# Patient Record
Sex: Male | Born: 1950 | Race: Black or African American | Hispanic: No | Marital: Single | Smoking: Never smoker
Health system: Southern US, Community
[De-identification: ages and names within clinical notes are randomized; demographics above are authoritative.]

## PROBLEM LIST (undated history)

## (undated) DIAGNOSIS — N189 Chronic kidney disease, unspecified: Secondary | ICD-10-CM

## (undated) DIAGNOSIS — G4730 Sleep apnea, unspecified: Secondary | ICD-10-CM

## (undated) DIAGNOSIS — M1990 Unspecified osteoarthritis, unspecified site: Secondary | ICD-10-CM

## (undated) DIAGNOSIS — E7800 Pure hypercholesterolemia, unspecified: Secondary | ICD-10-CM

## (undated) DIAGNOSIS — N200 Calculus of kidney: Secondary | ICD-10-CM

## (undated) DIAGNOSIS — K219 Gastro-esophageal reflux disease without esophagitis: Secondary | ICD-10-CM

## (undated) DIAGNOSIS — E119 Type 2 diabetes mellitus without complications: Secondary | ICD-10-CM

## (undated) DIAGNOSIS — I10 Essential (primary) hypertension: Secondary | ICD-10-CM

## (undated) DIAGNOSIS — H409 Unspecified glaucoma: Secondary | ICD-10-CM

## (undated) DIAGNOSIS — T884XXA Failed or difficult intubation, initial encounter: Secondary | ICD-10-CM

## (undated) HISTORY — PX: TONSILLECTOMY: SUR1361

## (undated) HISTORY — PX: OTHER SURGICAL HISTORY: SHX169

## (undated) HISTORY — PX: WRIST SURGERY: SHX841

## (undated) HISTORY — DX: Essential (primary) hypertension: I10

## (undated) HISTORY — DX: Type 2 diabetes mellitus without complications: E11.9

---

## 2004-10-03 ENCOUNTER — Ambulatory Visit: Payer: Self-pay | Admitting: Orthopedic Surgery

## 2004-12-26 ENCOUNTER — Ambulatory Visit: Payer: Self-pay | Admitting: Internal Medicine

## 2005-09-26 ENCOUNTER — Ambulatory Visit: Payer: Self-pay | Admitting: Gastroenterology

## 2007-12-07 ENCOUNTER — Ambulatory Visit: Payer: Self-pay | Admitting: Internal Medicine

## 2009-01-12 ENCOUNTER — Ambulatory Visit: Payer: Self-pay | Admitting: Internal Medicine

## 2010-07-10 ENCOUNTER — Ambulatory Visit: Payer: Self-pay | Admitting: Internal Medicine

## 2010-09-16 ENCOUNTER — Ambulatory Visit: Payer: Self-pay | Admitting: Gastroenterology

## 2010-09-17 LAB — PATHOLOGY REPORT

## 2010-10-02 ENCOUNTER — Ambulatory Visit: Payer: Self-pay | Admitting: Urology

## 2010-12-16 ENCOUNTER — Ambulatory Visit: Payer: Self-pay | Admitting: Urology

## 2011-06-16 ENCOUNTER — Ambulatory Visit: Payer: Self-pay | Admitting: Urology

## 2011-12-15 ENCOUNTER — Ambulatory Visit: Payer: Self-pay | Admitting: Urology

## 2012-06-18 ENCOUNTER — Ambulatory Visit: Payer: Self-pay | Admitting: Urology

## 2012-06-18 DIAGNOSIS — N200 Calculus of kidney: Secondary | ICD-10-CM | POA: Insufficient documentation

## 2012-06-18 DIAGNOSIS — R81 Glycosuria: Secondary | ICD-10-CM | POA: Insufficient documentation

## 2012-06-18 DIAGNOSIS — R972 Elevated prostate specific antigen [PSA]: Secondary | ICD-10-CM | POA: Insufficient documentation

## 2013-06-20 ENCOUNTER — Ambulatory Visit: Payer: Self-pay | Admitting: Urology

## 2013-06-20 DIAGNOSIS — R3915 Urgency of urination: Secondary | ICD-10-CM | POA: Insufficient documentation

## 2013-06-20 DIAGNOSIS — N138 Other obstructive and reflux uropathy: Secondary | ICD-10-CM | POA: Insufficient documentation

## 2013-07-06 ENCOUNTER — Other Ambulatory Visit: Payer: Self-pay | Admitting: Internal Medicine

## 2013-07-06 DIAGNOSIS — R9389 Abnormal findings on diagnostic imaging of other specified body structures: Secondary | ICD-10-CM

## 2013-07-19 ENCOUNTER — Ambulatory Visit
Admission: RE | Admit: 2013-07-19 | Discharge: 2013-07-19 | Disposition: A | Discharge status: Home / Self Care | Payer: Federal, State, Local not specified - PPO | Source: Ambulatory Visit | Attending: Internal Medicine | Admitting: Internal Medicine

## 2013-07-19 DIAGNOSIS — R9389 Abnormal findings on diagnostic imaging of other specified body structures: Secondary | ICD-10-CM

## 2013-07-19 MED ORDER — GADOBENATE DIMEGLUMINE 529 MG/ML IV SOLN
20.0000 mL | Freq: Once | INTRAVENOUS | Status: AC | PRN
  Administered 2013-07-19: 20 mL via INTRAVENOUS

## 2014-03-05 DIAGNOSIS — E1169 Type 2 diabetes mellitus with other specified complication: Secondary | ICD-10-CM | POA: Insufficient documentation

## 2014-03-05 DIAGNOSIS — E785 Hyperlipidemia, unspecified: Secondary | ICD-10-CM | POA: Insufficient documentation

## 2014-03-07 DIAGNOSIS — I10 Essential (primary) hypertension: Secondary | ICD-10-CM | POA: Insufficient documentation

## 2014-05-02 DIAGNOSIS — E119 Type 2 diabetes mellitus without complications: Secondary | ICD-10-CM | POA: Insufficient documentation

## 2014-05-02 DIAGNOSIS — I739 Peripheral vascular disease, unspecified: Secondary | ICD-10-CM | POA: Insufficient documentation

## 2014-09-04 ENCOUNTER — Ambulatory Visit (INDEPENDENT_AMBULATORY_CARE_PROVIDER_SITE_OTHER): Payer: Federal, State, Local not specified - PPO | Admitting: Podiatry

## 2014-09-04 DIAGNOSIS — E119 Type 2 diabetes mellitus without complications: Secondary | ICD-10-CM

## 2014-09-04 MED ORDER — NAFTIFINE HCL 1 % EX CREA: TOPICAL_CREAM | Freq: Every day | CUTANEOUS | Status: DC

## 2014-09-04 NOTE — Progress Notes (Signed)
No lesions noted today. Some discoloration in hallux B apparent, mostly on R. Needs new Rx for  Topical AF med., for occasional interdigital tinea.  Pulses are strongly palpable bilaterally. Neurologic sensorium is intact her Semmes-Weinstein monofilament. No changes in physical exam no open lesions no skin conditions other than a mild tinea pedis interdigitally.  Assessment: Interdigital tinea pedis with diabetes without complications.  Plan: Wrote another prescription for Naftin cream 1% follow-up with him as needed.

## 2014-09-08 ENCOUNTER — Ambulatory Visit: Payer: Self-pay

## 2015-01-08 ENCOUNTER — Other Ambulatory Visit: Payer: Self-pay

## 2015-01-17 ENCOUNTER — Ambulatory Visit (INDEPENDENT_AMBULATORY_CARE_PROVIDER_SITE_OTHER): Payer: Federal, State, Local not specified - PPO | Admitting: Podiatry

## 2015-01-17 VITALS — BP 117/69 | HR 80 | Resp 16

## 2015-01-17 DIAGNOSIS — L03011 Cellulitis of right finger: Secondary | ICD-10-CM

## 2015-01-17 DIAGNOSIS — L600 Ingrowing nail: Secondary | ICD-10-CM | POA: Diagnosis not present

## 2015-01-17 DIAGNOSIS — L03031 Cellulitis of right toe: Secondary | ICD-10-CM

## 2015-01-17 NOTE — Patient Instructions (Signed)

## 2015-01-17 NOTE — Progress Notes (Signed)
Patient presents today with an injury to the toenail of the 5th toe right. He states that he hit the toe last week and the toenail became loose. He has been cleaning with peroxide.   Objective: Vital signs stable alert and oriented x 3. Pulses are strong and palpable. The toenail appears loose and detached from the nail bed on the 5th digit right.  Assessment: Paronychia 5th digit right foot.  Plan: Nail avulsion was performed today after local anesthesia was achieved. Patient tolerated procedure well as the nail was removed in total. Patient will start soaking it twice a day in Betadine warm water and apply antibiotic ointment and covered with band aid daily. These instructions were provided and reviewed. Will follow up with him in 1 week.

## 2015-01-25 ENCOUNTER — Encounter: Payer: Self-pay | Admitting: Podiatry

## 2015-01-25 ENCOUNTER — Ambulatory Visit (INDEPENDENT_AMBULATORY_CARE_PROVIDER_SITE_OTHER): Payer: Federal, State, Local not specified - PPO | Admitting: Podiatry

## 2015-01-25 VITALS — BP 132/84 | HR 69 | Resp 17

## 2015-01-25 DIAGNOSIS — Z9889 Other specified postprocedural states: Secondary | ICD-10-CM

## 2015-01-25 DIAGNOSIS — L03031 Cellulitis of right toe: Secondary | ICD-10-CM

## 2015-01-25 DIAGNOSIS — L03011 Cellulitis of right finger: Secondary | ICD-10-CM

## 2015-01-25 NOTE — Patient Instructions (Addendum)
Soak Instructions    THE DAY AFTER THE PROCEDURE  Place 1/4 cup of epsom salts in a quart of warm tap water.  Submerge your foot or feet with outer bandage intact for the initial soak; this will allow the bandage to become moist and wet for easy lift off.  Once you remove your bandage, continue to soak in the solution for 20 minutes.  This soak should be done twice a day.  Next, remove your foot or feet from solution, blot dry the affected area and cover.  You may use a band aid large enough to cover the area or use gauze and tape.  Apply other medications to the area as directed by the doctor such as polysporin neosporin.  IF YOUR SKIN BECOMES IRRITATED WHILE USING THESE INSTRUCTIONS, IT IS OKAY TO SWITCH TO  WHITE VINEGAR AND WATER. Or you may use antibacterial soap and water to keep the toe clean  Monitor for any signs/symptoms of infection. Call the office immediately if any occur or go directly to the emergency room. Call with any questions/concerns.   ---   Continue soaking in epsom salts twice a day followed by antibiotic ointment and a band-aid. Can leave uncovered at night. Continue this until completely healed.  If the area has not healed in 2 weeks, call the office for follow-up appointment, or sooner if any problems arise.  Monitor for any signs/symptoms of infection. Call the office immediately if any occur or go directly to the emergency room. Call with any questions/concerns.  

## 2015-01-28 ENCOUNTER — Encounter: Payer: Self-pay | Admitting: Podiatry

## 2015-01-28 NOTE — Progress Notes (Signed)
Patient ID: Devon Valencia, male   DOB: 05/01/1951, 64 y.o.   MRN: [id number]  Subjective: Patient presents today for follow-up evaluation s/p right 5th digit toenail removal due to paronychia.  he states he's been soaking his foot twice a day in Betadine soaks covering with antibiotic ointment and a Band-Aid. He states that since the procedure he has had no pain to the area and denies any redness or drainage. Denies any systemic complaints as fevers, chills, nausea, vomiting. No other complaints at this time.   Objective : AAO 3, NAD Neurovascular status intact Status post right fifth digit toenail removal. There is no tenderness to palpation around the area and there is no swelling erythema, ascending cellulitis, fluctuance, crepitus, drainage, malodor. Small scab is starting to form over the procedure site. No other areas of tenderness to bilateral lower chemise. No edema, erythema, increase in warmth. There is no pain with calf compression, swelling, warmth, erythema.  Assessment: Status post right fifth digit toenail removal with resolved paronychia  Plan: Continue soaking in epsom salts twice a day followed by antibiotic ointment and a band-aid. Can leave uncovered at night. Continue this until completely healed.  If the area has not healed in 2 weeks, call the office for follow-up appointment, or sooner if any problems arise.  Monitor for any signs/symptoms of infection. Call the office immediately if any occur or go directly to the emergency room. Call with any questions/concerns.  Ovid CurdMatthew Danyela Posas, DPM

## 2015-04-13 ENCOUNTER — Emergency Department
Admission: EM | Admit: 2015-04-13 | Discharge: 2015-04-13 | Disposition: A | Discharge status: Home / Self Care | Payer: Medicare Other | Attending: Emergency Medicine | Admitting: Emergency Medicine

## 2015-04-13 ENCOUNTER — Encounter: Payer: Self-pay | Admitting: Emergency Medicine

## 2015-04-13 DIAGNOSIS — E119 Type 2 diabetes mellitus without complications: Secondary | ICD-10-CM | POA: Diagnosis not present

## 2015-04-13 DIAGNOSIS — I10 Essential (primary) hypertension: Secondary | ICD-10-CM | POA: Diagnosis not present

## 2015-04-13 DIAGNOSIS — Z7982 Long term (current) use of aspirin: Secondary | ICD-10-CM | POA: Insufficient documentation

## 2015-04-13 DIAGNOSIS — R21 Rash and other nonspecific skin eruption: Secondary | ICD-10-CM | POA: Diagnosis present

## 2015-04-13 DIAGNOSIS — Z79899 Other long term (current) drug therapy: Secondary | ICD-10-CM | POA: Insufficient documentation

## 2015-04-13 DIAGNOSIS — Z7951 Long term (current) use of inhaled steroids: Secondary | ICD-10-CM | POA: Insufficient documentation

## 2015-04-13 LAB — GLUCOSE, CAPILLARY: Glucose-Capillary: 134 mg/dL — ABNORMAL HIGH (ref 65–99)

## 2015-04-13 MED ORDER — PREDNISONE 10 MG PO TABS: ORAL_TABLET | ORAL | Status: DC

## 2015-04-13 MED ORDER — HYDROXYZINE HCL 25 MG PO TABS: 25.0000 mg | ORAL_TABLET | Freq: Four times a day (QID) | ORAL | Status: DC | PRN

## 2015-04-13 NOTE — ED Provider Notes (Signed)
[facility] Emergency Department Devon Valencia Note  ____________________________________________  Time seen: Approximately 7:29 AM  I have reviewed the triage vital signs and the nursing notes.   HISTORY  Chief Complaint Rash and Fever  HPI Devon Valencia is a 64 y.o. male is here with complaint of rash for approximate 5 days. Patient states that he has been doing some yard work in the beginning of the week when he first noticed the rash. He states that it is itching therefore he is scratching. He has noticed some outbreaks on his upper extremities and abdomen but has not noticed any on his lower extremities. He denies any known exposure to poison oak or poison ivy. He states that it is possible these are mosquito bites. He was seen Wednesday at [facility] clinic by Devon Valencia who prescribed Kenalog cream. Patient states that this helps temporarily but is not making it go away. Patient is diabetic. This morning he felt "warm" and wondered if he was running fever.   Past Medical History  Diagnosis Date  . Hypertension   . Diabetes mellitus without complication     Patient Active Problem List   Diagnosis Date Noted  . Peripheral blood vessel disorder 05/02/2014  . Diabetes mellitus, type 2 05/02/2014  . Essential (primary) hypertension 03/07/2014  . HLD (hyperlipidemia) 03/05/2014  . Benign prostatic hyperplasia with urinary obstruction 06/20/2013  . Urgency of micturation 06/20/2013  . Calculus of kidney 06/18/2012  . Glucose found in urine on examination 06/18/2012    Past Surgical History  Procedure Laterality Date  . Tonsillectomy    . Wrist surgery Right     Tendonitis    Current Outpatient Rx  Name  Route  Sig  Dispense  Refill  . aspirin EC 81 MG tablet   Oral   Take by mouth.         Marland Kitchen atorvastatin (LIPITOR) 40 MG tablet   Oral   Take by mouth.         . brimonidine-timolol (COMBIGAN) 0.2-0.5 % ophthalmic solution                . Canagliflozin-Metformin HCl (INVOKAMET) 50-1000 MG TABS   Oral   Take by mouth.         . losartan (COZAAR) 50 MG tablet   Oral   Take by mouth.         . ondansetron (ZOFRAN) 4 MG tablet   Oral   Take 4 mg by mouth every 8 (eight) hours as needed for nausea or vomiting.         . Azelastine-Fluticasone 137-50 MCG/ACT SUSP      by Each Nare route.         Marland Kitchen BYDUREON 2 MG SRER                 Dispense as written.   . hydrOXYzine (ATARAX/VISTARIL) 25 MG tablet   Oral   Take 1 tablet (25 mg total) by mouth every 6 (six) hours as needed for itching.   30 tablet   0   . naftifine (NAFTIN) 1 % cream   Topical   Apply topically daily. Apply to affected area twice daily.   90 g   5   . Potassium Citrate 15 MEQ (1620 MG) TBCR               . predniSONE (DELTASONE) 10 MG tablet      Take 3 tablets once a day for 3 days  30 tablet   0     Allergies Lisinopril  No family history on file.  Social History Social History  Substance Use Topics  . Smoking status: Never Smoker   . Smokeless tobacco: Never Used  . Alcohol Use: No    Review of Systems Constitutional: Questionable fever/no chills ENT: No sore throat. Cardiovascular: Denies chest pain. Respiratory: Denies shortness of breath. Gastrointestinal:   No nausea, no vomiting.   Genitourinary: Negative for dysuria. Musculoskeletal: Negative for back pain. Skin: Positive for rash torso and upper extremities Neurological: Negative for headaches, focal weakness or numbness.  10-point ROS otherwise negative.  ____________________________________________   PHYSICAL EXAM:  VITAL SIGNS: ED Triage Vitals  Enc Vitals Group     BP 04/13/15 0723 139/84 mmHg     Pulse Rate 04/13/15 0723 88     Resp --      Temp 04/13/15 0723 98.1 F (36.7 C)     Temp Source 04/13/15 0723 Oral     SpO2 04/13/15 0723 97 %     Weight 04/13/15 0723 221 lb (100.245 kg)     Height 04/13/15 0723  (1.676  m)     Head Cir --      Peak Flow --      Pain Score --      Pain Loc --      Pain Edu? --      Excl. in GC? --     Constitutional: Alert and oriented. Well appearing and in no acute distress. Eyes: Conjunctivae are normal. PERRL. EOMI. Head: Atraumatic. Nose: No congestion/rhinnorhea. Neck: No stridor.   Cardiovascular: Normal rate, regular rhythm. Grossly normal heart sounds.  Good peripheral circulation. Respiratory: Normal respiratory effort.  No retractions. Lungs CTAB. Gastrointestinal: Soft and nontender. No distention. Musculoskeletal: No lower extremity tenderness nor edema.  No joint effusions. Neurologic:  Normal speech and language. No gross focal neurologic deficits are appreciated. No gait instability. Skin:  Skin is warm, dry and intact. Upper extremities and umbilicus has red isolated areas of erythematous papules. A few of these have open caps that appear to be scratched. These are isolated and discrete. There is one erythematous papule nontender right mid chest area that is isolated. Psychiatric: Mood and affect are normal. Speech and behavior are normal.  ____________________________________________   LABS (all labs ordered are listed, but only abnormal results are displayed)  Labs Reviewed  GLUCOSE, CAPILLARY - Abnormal; Notable for the following:    Glucose-Capillary 134 (*)    All other components within normal limits  CBG MONITORING, ED    PROCEDURES  Procedure(s) performed: None  Critical Care performed: No  ____________________________________________   INITIAL IMPRESSION / ASSESSMENT AND PLAN / ED COURSE  Pertinent labs & imaging results that were available during my care of the patient were reviewed by me and considered in my medical decision making (see chart for details).  She was advised to follow-up with dermatology if not improving. 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 We discussed  not scratching at these areas because of his diabetes they could get infected very easily. He is to continue using his Kenalog cream to the areas. He was also discharged on prednisone 30 mg once a day for 3 days. He is also given a prescription for Atarax 25 mg every 6 hours when necessary itching. He was given the name of 2 dermatology offices to contact if any continued problems. ____________________________________________  FINAL CLINICAL IMPRESSION(S) / ED DIAGNOSES  Final diagnoses:  Rash and nonspecific skin eruption      Tommi Rumps, PA-C 04/13/15 1610  Arnaldo Natal, MD 04/13/15 [phone]

## 2015-04-13 NOTE — Discharge Instructions (Signed)
° °  White her taking prednisone please check blood sugars before meals and at bedtime. Take medication as prescribed. Follow-up with your family doctor or call Dr. Pilar Plate, dermatologist for further evaluation of your skin rash You may continue using the cream that was prescribed to you this week as well.

## 2015-04-13 NOTE — ED Notes (Signed)
BGL 134

## 2015-04-13 NOTE — ED Notes (Signed)
Patient to vesicular rash to extremities and trunk. Patient states he thought he had fever this am, but did not check temperature to be sure. Patient is diabetic, checked blood sugar this morning with result less than 120. States initial rash started 5 days ago. Denies any drainage. +Itching.

## 2015-05-21 DIAGNOSIS — Z0000 Encounter for general adult medical examination without abnormal findings: Secondary | ICD-10-CM | POA: Insufficient documentation

## 2015-08-21 ENCOUNTER — Ambulatory Visit (INDEPENDENT_AMBULATORY_CARE_PROVIDER_SITE_OTHER): Payer: Federal, State, Local not specified - PPO | Admitting: Podiatry

## 2015-08-21 ENCOUNTER — Encounter: Payer: Self-pay | Admitting: Podiatry

## 2015-08-21 DIAGNOSIS — B351 Tinea unguium: Secondary | ICD-10-CM | POA: Diagnosis not present

## 2015-08-21 DIAGNOSIS — M79676 Pain in unspecified toe(s): Secondary | ICD-10-CM

## 2015-08-21 DIAGNOSIS — E119 Type 2 diabetes mellitus without complications: Secondary | ICD-10-CM | POA: Diagnosis not present

## 2015-08-21 NOTE — Patient Instructions (Signed)
Diabetes and Foot Care Diabetes may cause you to have problems because of poor blood supply (circulation) to your feet and legs. This may cause the skin on your feet to become thinner, break easier, and heal more slowly. Your skin may become dry, and the skin may peel and crack. You may also have nerve damage in your legs and feet causing decreased feeling in them. You may not notice minor injuries to your feet that could lead to infections or more serious problems. Taking care of your feet is one of the most important things you can do for yourself.  HOME CARE INSTRUCTIONS  Wear shoes at all times, even in the house. Do not go barefoot. Bare feet are easily injured.  Check your feet daily for blisters, cuts, and redness. If you cannot see the bottom of your feet, use a mirror or ask someone for help.  Wash your feet with warm water (do not use hot water) and mild soap. Then pat your feet and the areas between your toes until they are completely dry. Do not soak your feet as this can dry your skin.  Apply a moisturizing lotion or petroleum jelly (that does not contain alcohol and is unscented) to the skin on your feet and to dry, brittle toenails. Do not apply lotion between your toes.  Trim your toenails straight across. Do not dig under them or around the cuticle. File the edges of your nails with an emery board or nail file.  Do not cut corns or calluses or try to remove them with medicine.  Wear clean socks or stockings every day. Make sure they are not too tight. Do not wear knee-high stockings since they may decrease blood flow to your legs.  Wear shoes that fit properly and have enough cushioning. To break in new shoes, wear them for just a few hours a day. This prevents you from injuring your feet. Always look in your shoes before you put them on to be sure there are no objects inside.  Do not cross your legs. This may decrease the blood flow to your feet.  If you find a minor scrape,  cut, or break in the skin on your feet, keep it and the skin around it clean and dry. These areas may be cleansed with mild soap and water. Do not cleanse the area with peroxide, alcohol, or iodine.  When you remove an adhesive bandage, be sure not to damage the skin around it.  If you have a wound, look at it several times a day to make sure it is healing.  Do not use heating pads or hot water bottles. They may burn your skin. If you have lost feeling in your feet or legs, you may not know it is happening until it is too late.  Make sure your health care provider performs a complete foot exam at least annually or more often if you have foot problems. Report any cuts, sores, or bruises to your health care provider immediately. SEEK MEDICAL CARE IF:   You have an injury that is not healing.  You have cuts or breaks in the skin.  You have an ingrown nail.  You notice redness on your legs or feet.  You feel burning or tingling in your legs or feet.  You have pain or cramps in your legs and feet.  Your legs or feet are numb.  Your feet always feel cold. SEEK IMMEDIATE MEDICAL CARE IF:   There is increasing redness,   swelling, or pain in or around a wound.  There is a red line that goes up your leg.  Pus is coming from a wound.  You develop a fever or as directed by your health care provider.  You notice a bad smell coming from an ulcer or wound.   This information is not intended to replace advice given to you by your health care provider. Make sure you discuss any questions you have with your health care provider.   Document Released: 06/27/2000 Document Revised: 03/02/2013 Document Reviewed: 12/07/2012 Elsevier Interactive Patient Education 2016 [employer]  

## 2015-08-24 NOTE — Progress Notes (Signed)
Patient ID: Devon Valencia, male   DOB: 03-23-1951, 65 y.o.   MRN: [id number]  Devon Valencia presents today for diabetic risk assessment and for painful, elongated toenails show which she cannot trim himself. He denies any drainage or pus coming from around the nails and denies any swelling or redness. He states the nails cause tenderness with pressure in shoe gear. He believes that his A1c is around 7 unsure. Denies any change in her numbness. No claudication symptoms. No ulcerations. No other complaints at this time.  Objective : AAO 3, NAD DP/PT pulses 2/4, CRT less than 3 seconds Protective sensation appears to be intact with Devon Valencia monofilament Nails are hypertrophic, dystrophic, brittle, discolored, elongated 10. There is no surrounding erythema or drainage. There is tenderness to palpation to nails 1-5 bilaterally. No open lesions or pre-ulcerative lesions are identified bilaterally. No other areas of tenderness to bilateral lower extremities. There is no pain with calf compression, swelling, warmth, erythema.  Assessment: 65 year old male presents for diabetic risk assessment as well as for symptomatic onychomycosis  Plan: -Treatment options discussed including all alternatives, risks, and complications -Nails sharply debrided 10 without complications or bleeding -Discussed the importance of daily foot inspection -Follow-up in 3 months or sooner if any problems arise. In the meantime, encouraged to call the office with any questions, concerns, change in symptoms.   Ovid Curd, DPM

## 2015-09-03 ENCOUNTER — Ambulatory Visit: Payer: Federal, State, Local not specified - PPO | Admitting: Podiatry

## 2015-09-03 ENCOUNTER — Ambulatory Visit: Payer: Federal, State, Local not specified - PPO

## 2015-11-19 DIAGNOSIS — G4733 Obstructive sleep apnea (adult) (pediatric): Secondary | ICD-10-CM | POA: Insufficient documentation

## 2015-11-19 DIAGNOSIS — Z9989 Dependence on other enabling machines and devices: Secondary | ICD-10-CM

## 2015-11-20 ENCOUNTER — Ambulatory Visit: Payer: Federal, State, Local not specified - PPO | Admitting: Podiatry

## 2015-12-21 ENCOUNTER — Encounter: Payer: Self-pay | Admitting: *Deleted

## 2015-12-24 ENCOUNTER — Ambulatory Visit: Payer: Medicare Other | Admitting: Anesthesiology

## 2015-12-24 ENCOUNTER — Encounter
Admission: RE | Disposition: A | Discharge status: Home / Self Care | Payer: Self-pay | Source: Ambulatory Visit | Attending: Gastroenterology

## 2015-12-24 ENCOUNTER — Ambulatory Visit
Admission: RE | Admit: 2015-12-24 | Discharge: 2015-12-24 | Disposition: A | Discharge status: Home / Self Care | Payer: Medicare Other | Source: Ambulatory Visit | Attending: Gastroenterology | Admitting: Gastroenterology

## 2015-12-24 ENCOUNTER — Encounter: Payer: Self-pay | Admitting: *Deleted

## 2015-12-24 DIAGNOSIS — I129 Hypertensive chronic kidney disease with stage 1 through stage 4 chronic kidney disease, or unspecified chronic kidney disease: Secondary | ICD-10-CM | POA: Diagnosis not present

## 2015-12-24 DIAGNOSIS — K5730 Diverticulosis of large intestine without perforation or abscess without bleeding: Secondary | ICD-10-CM | POA: Diagnosis not present

## 2015-12-24 DIAGNOSIS — G4730 Sleep apnea, unspecified: Secondary | ICD-10-CM | POA: Insufficient documentation

## 2015-12-24 DIAGNOSIS — Z7984 Long term (current) use of oral hypoglycemic drugs: Secondary | ICD-10-CM | POA: Insufficient documentation

## 2015-12-24 DIAGNOSIS — E119 Type 2 diabetes mellitus without complications: Secondary | ICD-10-CM | POA: Diagnosis not present

## 2015-12-24 DIAGNOSIS — Z1211 Encounter for screening for malignant neoplasm of colon: Secondary | ICD-10-CM | POA: Diagnosis present

## 2015-12-24 DIAGNOSIS — M1990 Unspecified osteoarthritis, unspecified site: Secondary | ICD-10-CM | POA: Insufficient documentation

## 2015-12-24 DIAGNOSIS — K219 Gastro-esophageal reflux disease without esophagitis: Secondary | ICD-10-CM | POA: Insufficient documentation

## 2015-12-24 DIAGNOSIS — Z7982 Long term (current) use of aspirin: Secondary | ICD-10-CM | POA: Insufficient documentation

## 2015-12-24 DIAGNOSIS — Z86010 Personal history of colonic polyps: Secondary | ICD-10-CM | POA: Insufficient documentation

## 2015-12-24 DIAGNOSIS — Z79899 Other long term (current) drug therapy: Secondary | ICD-10-CM | POA: Insufficient documentation

## 2015-12-24 HISTORY — DX: Chronic kidney disease, unspecified: N18.9

## 2015-12-24 HISTORY — DX: Unspecified osteoarthritis, unspecified site: M19.90

## 2015-12-24 HISTORY — DX: Calculus of kidney: N20.0

## 2015-12-24 HISTORY — DX: Sleep apnea, unspecified: G47.30

## 2015-12-24 HISTORY — PX: COLONOSCOPY WITH PROPOFOL: SHX5780

## 2015-12-24 HISTORY — DX: Pure hypercholesterolemia, unspecified: E78.00

## 2015-12-24 HISTORY — DX: Unspecified glaucoma: H40.9

## 2015-12-24 HISTORY — DX: Gastro-esophageal reflux disease without esophagitis: K21.9

## 2015-12-24 LAB — GLUCOSE, CAPILLARY: GLUCOSE-CAPILLARY: 104 mg/dL — AB (ref 65–99)

## 2015-12-24 SURGERY — COLONOSCOPY WITH PROPOFOL
Anesthesia: General

## 2015-12-24 MED ORDER — LIDOCAINE HCL (PF) 1 % IJ SOLN
INTRAMUSCULAR | Status: AC
  Filled 2015-12-24: qty 2

## 2015-12-24 MED ORDER — LIDOCAINE HCL (PF) 1 % IJ SOLN: 2.0000 mL | Freq: Once | INTRAMUSCULAR | Status: DC

## 2015-12-24 MED ORDER — SODIUM CHLORIDE 0.9 % IV SOLN
INTRAVENOUS | Status: DC
  Administered 2015-12-24 (×2): via INTRAVENOUS

## 2015-12-24 MED ORDER — MIDAZOLAM HCL 2 MG/2ML IJ SOLN
INTRAMUSCULAR | Status: DC | PRN
  Administered 2015-12-24: 1 mg via INTRAVENOUS

## 2015-12-24 MED ORDER — FENTANYL CITRATE (PF) 100 MCG/2ML IJ SOLN
INTRAMUSCULAR | Status: DC | PRN
  Administered 2015-12-24: 50 ug via INTRAVENOUS

## 2015-12-24 MED ORDER — PROPOFOL 500 MG/50ML IV EMUL
INTRAVENOUS | Status: DC | PRN
  Administered 2015-12-24: 150 ug/kg/min via INTRAVENOUS

## 2015-12-24 MED ORDER — PROPOFOL 10 MG/ML IV BOLUS
INTRAVENOUS | Status: DC | PRN
  Administered 2015-12-24: 70 mg via INTRAVENOUS

## 2015-12-24 MED ORDER — SODIUM CHLORIDE 0.9 % IV SOLN: INTRAVENOUS | Status: DC

## 2015-12-24 NOTE — H&P (Signed)
Outpatient short stay form Pre-procedure 12/24/2015 1:33 PM Devon Deem MD  Primary Physician: Dr. Einar Valencia  Reason for visit:  Colonoscopy  History of present illness:  Patient is a 65 year old male presenting today for colonoscopy. His last colonoscopy was in 2012 which showed a hyperplastic polyp at that time however he has had a history previously of adenomatous colon polyps. He is presenting today for follow-up evaluation. He tolerated his prep well. He'll for couple days. He takes no other blood thinning agents or other aspirin products.    Current facility-administered medications:  .  0.9 %  sodium chloride infusion, , Intravenous, Continuous, Devon Deem, MD, Last Rate: 20 mL/hr at 12/24/15 1306 .  0.9 %  sodium chloride infusion, , Intravenous, Continuous, Devon Deem, MD .  lidocaine (PF) (XYLOCAINE) 1 % injection 2 mL, 2 mL, Intradermal, Once, Devon Deem, MD .  lidocaine (PF) (XYLOCAINE) 1 % injection, , , ,   Prescriptions prior to admission  Medication Sig Dispense Refill Last Dose  . aspirin EC 81 MG tablet Take by mouth.   Past Month at Unknown time  . brimonidine-timolol (COMBIGAN) 0.2-0.5 % ophthalmic solution    12/23/2015 at Unknown time  . Dulaglutide (TRULICITY) 1.5 MG/0.5ML SOPN Inject 1.5 mg into the skin every 7 (seven) days.   Past Week at Unknown time  . losartan (COZAAR) 50 MG tablet Take by mouth.   Past Week at Unknown time  . ondansetron (ZOFRAN) 4 MG tablet Take 4 mg by mouth every 8 (eight) hours as needed for nausea or vomiting.   Past Month at Unknown time  . Potassium Citrate 15 MEQ (1620 MG) TBCR    Past Week at Unknown time  . atorvastatin (LIPITOR) 40 MG tablet Take by mouth.     . Azelastine-Fluticasone 137-50 MCG/ACT SUSP Reported on 12/24/2015   Not Taking at Unknown time  . BYDUREON 2 MG SRER Reported on 12/24/2015   Not Taking at Unknown time  . Canagliflozin-Metformin HCl (INVOKAMET) 50-1000 MG TABS Take by mouth.  Reported on 12/24/2015   Not Taking at Unknown time  . hydrOXYzine (ATARAX/VISTARIL) 25 MG tablet Take 1 tablet (25 mg total) by mouth every 6 (six) hours as needed for itching. 30 tablet 0 Not Taking at Unknown time  . naftifine (NAFTIN) 1 % cream Apply topically daily. Apply to affected area twice daily. 90 g 5 Not Taking at Unknown time  . predniSONE (DELTASONE) 10 MG tablet Take 3 tablets once a day for 3 days 30 tablet 0 Not Taking at Unknown time  . triamcinolone cream (KENALOG) 0.1 % Apply 1 application topically 3 (three) times daily. Reported on 12/24/2015   Not Taking at Unknown time     Allergies  Allergen Reactions  . Lisinopril Other (See Comments), Cough and Nausea And Vomiting    cough cough     Past Medical History  Diagnosis Date  . Hypertension   . Diabetes mellitus without complication (HCC)   . Chronic kidney disease   . GERD (gastroesophageal reflux disease)   . Glaucoma   . Hypercholesteremia   . Nephrolithiasis   . Arthritis   . Sleep apnea     Review of systems:      Physical Exam    Heart and lungs: Regular rate and rhythm without rub or gallop, lungs are bilaterally clear.    HEENT: Normocephalic atraumatic eyes are anicteric    Other:     Pertinant exam for procedure: Soft  nontender nondistended bowel sounds positive normoactive.    Planned proceedures: Colonoscopy and indicated procedures. I have discussed the risks benefits and complications of procedures to include not limited to bleeding, infection, perforation and the risk of sedation and the patient wishes to proceed.    Devon DeemMartin U Cahlil Sattar, MD Gastroenterology 12/24/2015  1:33 PM

## 2015-12-24 NOTE — Anesthesia Preprocedure Evaluation (Signed)
Anesthesia Evaluation  Patient identified by MRN, date of birth, ID band Patient awake    Reviewed: Allergy & Precautions, NPO status , Patient's Chart, lab work & pertinent test results, reviewed documented beta blocker date and time   Airway Mallampati: III  TM Distance: >3 FB     Dental  (+) Chipped   Pulmonary sleep apnea ,           Cardiovascular hypertension, Pt. on medications + Peripheral Vascular Disease       Neuro/Psych    GI/Hepatic GERD  ,  Endo/Other  diabetes, Type 2  Renal/GU Renal InsufficiencyRenal disease     Musculoskeletal  (+) Arthritis ,   Abdominal   Peds  Hematology   Anesthesia Other Findings   Reproductive/Obstetrics                             Anesthesia Physical Anesthesia Plan  ASA: III  Anesthesia Plan: General   Post-op Pain Management:    Induction: Intravenous  Airway Management Planned: Nasal Cannula  Additional Equipment:   Intra-op Plan:   Post-operative Plan:   Informed Consent: I have reviewed the patients History and Physical, chart, labs and discussed the procedure including the risks, benefits and alternatives for the proposed anesthesia with the patient or authorized representative who has indicated his/her understanding and acceptance.     Plan Discussed with: CRNA  Anesthesia Plan Comments:         Anesthesia Quick Evaluation

## 2015-12-24 NOTE — Anesthesia Postprocedure Evaluation (Signed)
Anesthesia Post Note  Patient: Devon Valencia  Procedure(s) Performed: Procedure(s) (LRB): COLONOSCOPY WITH PROPOFOL (N/A)  Patient location during evaluation: Endoscopy Anesthesia Type: General Level of consciousness: awake and alert Pain management: pain level controlled Vital Signs Assessment: post-procedure vital signs reviewed and stable Respiratory status: spontaneous breathing, nonlabored ventilation, respiratory function stable and patient connected to nasal cannula oxygen Cardiovascular status: blood pressure returned to baseline and stable Postop Assessment: no signs of nausea or vomiting Anesthetic complications: no    Last Vitals:  Filed Vitals:   12/24/15 1450 12/24/15 1500  BP: 121/75 131/81  Pulse: 58 61  Temp:    Resp: 22 15    Last Pain: There were no vitals filed for this visit.               Lenard SimmerAndrew Takoda Siedlecki

## 2015-12-24 NOTE — Transfer of Care (Signed)
Immediate Anesthesia Transfer of Care Note  Patient: Devon ClarksGeorge Valencia  Procedure(s) Performed: Procedure(s): COLONOSCOPY WITH PROPOFOL (N/A)  Patient Location: PACU  Anesthesia Type:General  Level of Consciousness: sedated  Airway & Oxygen Therapy: Patient Spontanous Breathing and Patient connected to nasal cannula oxygen  Post-op Assessment: Report given to RN and Post -op Vital signs reviewed and stable  Post vital signs: Reviewed and stable  Last Vitals:  Filed Vitals:   12/24/15 1241  BP: 150/80  Pulse: 58  Temp: 36.1 C  Resp: 18    Last Pain: There were no vitals filed for this visit.       Complications: No apparent anesthesia complications

## 2015-12-24 NOTE — Op Note (Signed)
[facility] Gastroenterology Patient Name: Devon ClarksGeorge Agner Procedure Date: 12/24/2015 1:52 PM MRN: [id number] Account #: [id number] Date of Birth: 05/01/1951 Admit Type: Outpatient Age: [age] [facility] 3 Gender: Male Note Status: Finalized Procedure:            Colonoscopy Indications:          Personal history of colonic polyps Providers:            Christena DeemMartin U. Saamiya Jeppsen, MD Referring MD:         Marya AmslerMarshall W. Dareen PianoAnderson MD, MD (Referring MD) Medicines:            Monitored Anesthesia Care Complications:        No immediate complications. Procedure:            Pre-Anesthesia Assessment:                       - ASA Grade Assessment: III - A patient with severe                        systemic disease.                       After obtaining informed consent, the colonoscope was                        passed under direct vision. Throughout the procedure,                        the patient's blood pressure, pulse, and oxygen                        saturations were monitored continuously. The                        Colonoscope was introduced through the anus and                        advanced to the the cecum, identified by appendiceal                        orifice and ileocecal valve. The colonoscopy was                        performed with moderate difficulty due to significant                        looping. Successful completion of the procedure was                        aided by changing the patient to a prone position and                        using manual pressure. The patient tolerated the                        procedure well. The quality of the bowel preparation                        was fair. Findings:      Multiple medium-mouthed diverticula were found in the sigmoid colon and  descending colon.      The retroflexed view of the distal rectum and anal verge was normal and       showed no anal or rectal abnormalities.      The exam was otherwise  without abnormality.      The digital rectal exam was normal. Impression:           - Preparation of the colon was fair.                       - Diverticulosis in the sigmoid colon and in the                        descending colon.                       - The distal rectum and anal verge are normal on                        retroflexion view.                       - The examination was otherwise normal.                       - No specimens collected. Recommendation:       - Discharge patient to home.                       - Repeat colonoscopy in 5 years for surveillance. Procedure Code(s):    --- Professional ---                       [phone], Colonoscopy, flexible; diagnostic, including                        collection of specimen(s) by brushing or washing, when                        performed (separate procedure) Diagnosis Code(s):    --- Professional ---                       Z86.010, Personal history of colonic polyps                       K57.30, Diverticulosis of large intestine without                        perforation or abscess without bleeding CPT copyright 2016 American Medical Association. All rights reserved. The codes documented in this report are preliminary and upon coder review may  be revised to meet current compliance requirements. Christena Deem, MD 12/24/2015 2:25:28 PM This report has been signed electronically. Number of Addenda: 0 Note Initiated On: 12/24/2015 1:52 PM Scope Withdrawal Time: 0 hours 6 minutes 20 seconds  Total Procedure Duration: 0 hours 23 minutes 8 seconds       [facility]

## 2015-12-25 ENCOUNTER — Encounter: Payer: Self-pay | Admitting: Podiatry

## 2015-12-25 ENCOUNTER — Ambulatory Visit (INDEPENDENT_AMBULATORY_CARE_PROVIDER_SITE_OTHER): Payer: Medicare Other | Admitting: Podiatry

## 2015-12-25 DIAGNOSIS — B351 Tinea unguium: Secondary | ICD-10-CM | POA: Diagnosis not present

## 2015-12-25 DIAGNOSIS — E119 Type 2 diabetes mellitus without complications: Secondary | ICD-10-CM

## 2015-12-25 DIAGNOSIS — M79676 Pain in unspecified toe(s): Secondary | ICD-10-CM | POA: Diagnosis not present

## 2015-12-25 NOTE — Progress Notes (Signed)
Patient ID: Cleda ClarksGeorge Hepworth, male   DOB: 09/22/1950, 65 y.o.   MRN: [id number]  Mr. Laural BenesJohnson presents today for diabetic risk assessment and for painful, elongated toenails show which she cannot trim himself. He denies any drainage around the nails and denies any swelling or redness. No claudication symptoms. No ulcerations. No other complaints at this time.  Objective : AAO 3, NAD DP/PT pulses 2/4, CRT less than 3 seconds Protective sensation appears to be intact with Dorann OuSimms Weinstein monofilament Nails are hypertrophic, dystrophic, brittle, discolored, elongated 10. There is no surrounding erythema or drainage. There is tenderness to palpation to nails 1-5 bilaterally. No open lesions or pre-ulcerative lesions are identified bilaterally. No other areas of tenderness to bilateral lower extremities. There is no pain with calf compression, swelling, warmth, erythema.  Assessment: 65 year old male presents for diabetic risk assessment as well as for symptomatic onychomycosis  Plan: -Treatment options discussed including all alternatives, risks, and complications -Nails sharply debrided 10 without complications or bleeding -Discussed the importance of daily foot inspection -Follow-up in 3 months or sooner if any problems arise. In the meantime, encouraged to call the office with any questions, concerns, change in symptoms.   Ovid CurdMatthew Wagoner, DPM

## 2015-12-26 ENCOUNTER — Encounter: Payer: Self-pay | Admitting: Gastroenterology

## 2016-02-02 ENCOUNTER — Other Ambulatory Visit: Payer: Self-pay | Admitting: Podiatry

## 2016-02-04 NOTE — Telephone Encounter (Signed)
Pt needs an appt

## 2016-02-07 ENCOUNTER — Telehealth: Payer: Self-pay | Admitting: Podiatry

## 2016-02-07 NOTE — Telephone Encounter (Signed)
Patient stopped by the office to say that Dr. Al Corpus wrote him and RX for Naftifine Hydrochloride 2% topical cream and his insurance is denying for prior approval needed. Patient would like nurse to call him about this.

## 2016-02-07 NOTE — Telephone Encounter (Signed)
Informed pt our Prior Authorization Specialist had the Pre-cert request and would take care of the processing.

## 2016-02-08 ENCOUNTER — Telehealth: Payer: Self-pay | Admitting: *Deleted

## 2016-02-08 NOTE — Telephone Encounter (Signed)
Pt called for status of pre-cert for Naftin. 02/08/2016-[employer] APPROVED NAFTIFINE 2% CREAM, CASE ID:  [id number] - RX#:  [id number]. Faxed PA to [employer]. Informed pt the PA had been received this morning and faxed to [facility], to call them and have them look for the PA.

## 2016-03-27 ENCOUNTER — Ambulatory Visit: Payer: Medicare Other | Admitting: Podiatry

## 2016-04-04 ENCOUNTER — Ambulatory Visit (INDEPENDENT_AMBULATORY_CARE_PROVIDER_SITE_OTHER): Payer: Medicare Other | Admitting: Podiatry

## 2016-04-04 DIAGNOSIS — L603 Nail dystrophy: Secondary | ICD-10-CM

## 2016-04-04 DIAGNOSIS — L609 Nail disorder, unspecified: Secondary | ICD-10-CM

## 2016-04-04 DIAGNOSIS — L608 Other nail disorders: Secondary | ICD-10-CM

## 2016-04-04 DIAGNOSIS — B351 Tinea unguium: Secondary | ICD-10-CM

## 2016-04-04 DIAGNOSIS — M79609 Pain in unspecified limb: Secondary | ICD-10-CM

## 2016-04-04 DIAGNOSIS — E0843 Diabetes mellitus due to underlying condition with diabetic autonomic (poly)neuropathy: Secondary | ICD-10-CM

## 2016-04-04 NOTE — Patient Instructions (Addendum)
Diabetes and Foot Care Diabetes may cause you to have problems because of poor blood supply (circulation) to your feet and legs. This may cause the skin on your feet to become thinner, break easier, and heal more slowly. Your skin may become dry, and the skin may peel and crack. You may also have nerve damage in your legs and feet causing decreased feeling in them. You may not notice minor injuries to your feet that could lead to infections or more serious problems. Taking care of your feet is one of the most important things you can do for yourself.  HOME CARE INSTRUCTIONS  Wear shoes at all times, even in the house. Do not go barefoot. Bare feet are easily injured.  Check your feet daily for blisters, cuts, and redness. If you cannot see the bottom of your feet, use a mirror or ask someone for help.  Wash your feet with warm water (do not use hot water) and mild soap. Then pat your feet and the areas between your toes until they are completely dry. Do not soak your feet as this can dry your skin.  Apply a moisturizing lotion or petroleum jelly (that does not contain alcohol and is unscented) to the skin on your feet and to dry, brittle toenails. Do not apply lotion between your toes.  Trim your toenails straight across. Do not dig under them or around the cuticle. File the edges of your nails with an emery board or nail file.  Do not cut corns or calluses or try to remove them with medicine.  Wear clean socks or stockings every day. Make sure they are not too tight. Do not wear knee-high stockings since they may decrease blood flow to your legs.  Wear shoes that fit properly and have enough cushioning. To break in new shoes, wear them for just a few hours a day. This prevents you from injuring your feet. Always look in your shoes before you put them on to be sure there are no objects inside.  Do not cross your legs. This may decrease the blood flow to your feet.  If you find a minor scrape,  cut, or break in the skin on your feet, keep it and the skin around it clean and dry. These areas may be cleansed with mild soap and water. Do not cleanse the area with peroxide, alcohol, or iodine.  When you remove an adhesive bandage, be sure not to damage the skin around it.  If you have a wound, look at it several times a day to make sure it is healing.  Do not use heating pads or hot water bottles. They may burn your skin. If you have lost feeling in your feet or legs, you may not know it is happening until it is too late.  Make sure your health care provider performs a complete foot exam at least annually or more often if you have foot problems. Report any cuts, sores, or bruises to your health care provider immediately. SEEK MEDICAL CARE IF:   You have an injury that is not healing.  You have cuts or breaks in the skin.  You have an ingrown nail.  You notice redness on your legs or feet.  You feel burning or tingling in your legs or feet.  You have pain or cramps in your legs and feet.  Your legs or feet are numb.  Your feet always feel cold. SEEK IMMEDIATE MEDICAL CARE IF:   There is increasing redness,   swelling, or pain in or around a wound.  There is a red line that goes up your leg.  Pus is coming from a wound.  You develop a fever or as directed by your health care provider.  You notice a bad smell coming from an ulcer or wound.   This information is not intended to replace advice given to you by your health care provider. Make sure you discuss any questions you have with your health care provider.   Document Released: 06/27/2000 Document Revised: 03/02/2013 Document Reviewed: 12/07/2012 Elsevier Interactive Patient Education 2016 Elsevier Inc.  

## 2016-04-07 NOTE — Progress Notes (Signed)
SUBJECTIVE Patient with a history of diabetes mellitus presents to office today complaining of elongated, thickened nails. Pain while ambulating in shoes. Patient is unable to trim their own nails.   Allergies  Allergen Reactions  . Lisinopril Other (See Comments), Cough and Nausea And Vomiting    cough cough    OBJECTIVE General Patient is awake, alert, and oriented x 3 and in no acute distress. Derm Skin is dry and supple bilateral. Negative open lesions or macerations. Remaining integument unremarkable. Nails are tender, long, thickened and dystrophic with subungual debris, consistent with onychomycosis, 1-5 bilateral. No signs of infection noted. Vasc  DP and PT pedal pulses palpable bilaterally. Temperature gradient within normal limits.  Neuro Epicritic and protective threshold sensation diminished bilaterally.  Musculoskeletal Exam No symptomatic pedal deformities noted bilateral. Muscular strength within normal limits.  ASSESSMENT 1. Diabetes Mellitus w/ peripheral neuropathy 2. Onychomycosis of nail due to dermatophyte bilateral 3. Pain in foot bilateral  PLAN OF CARE Patient evaluated today. Instructed to maintain good pedal hygiene and foot care. Stressed importance of controlling blood sugar.  Mechanical debridement of nails 1-5 bilaterally performed using a nail nipper. Filed with dremel without incident.  Today nail biopsy was taken and sent to pathology for fungal culture. Discussed with patient the management of onychomycosis and different treatment modalities. All patient questions were answered. Return to clinic in 3 mos.    Felecia ShellingBrent M Evans, DPM

## 2016-04-25 ENCOUNTER — Ambulatory Visit (INDEPENDENT_AMBULATORY_CARE_PROVIDER_SITE_OTHER): Payer: Medicare Other | Admitting: Podiatry

## 2016-04-25 ENCOUNTER — Encounter: Payer: Self-pay | Admitting: Podiatry

## 2016-04-25 DIAGNOSIS — L603 Nail dystrophy: Secondary | ICD-10-CM | POA: Diagnosis not present

## 2016-04-25 DIAGNOSIS — M79609 Pain in unspecified limb: Secondary | ICD-10-CM | POA: Diagnosis not present

## 2016-04-25 DIAGNOSIS — B351 Tinea unguium: Secondary | ICD-10-CM | POA: Diagnosis not present

## 2016-04-25 DIAGNOSIS — Z79899 Other long term (current) drug therapy: Secondary | ICD-10-CM

## 2016-04-25 DIAGNOSIS — L608 Other nail disorders: Secondary | ICD-10-CM

## 2016-04-25 MED ORDER — TERBINAFINE HCL 250 MG PO TABS: 250.0000 mg | ORAL_TABLET | Freq: Every day | ORAL | 2 refills | Status: DC

## 2016-04-25 NOTE — Progress Notes (Signed)
Subjective: Patient presents today for possible treatment and evaluation of fungal nails bilaterally 1 through 5. Patient states that the nails have been discolored and thickened for greater than 1 month. Patient presents today for further treatment and evaluation.  Objective: Physical Exam General: The patient is alert and oriented x3 in no acute distress.  Dermatology: Hyperkeratotic, discolored, thickened, onychodystrophy of nails noted bilaterally.  Skin is warm, dry and supple bilateral lower extremities. Negative for open lesions or macerations.  Vascular: Palpable pedal pulses bilaterally. No edema or erythema noted. Capillary refill within normal limits.  Neurological: Epicritic and protective threshold grossly intact bilaterally.   Musculoskeletal Exam: Range of motion within normal limits to all pedal and ankle joints bilateral. Muscle strength 5/5 in all groups bilateral.   Assessment: #1 onychodystrophy bilateral toenails #2 possible onychomycosis #3 hyperkeratotic nails bilateral  Plan of Care:  #1 Patient was evaluated. #2 Orders for liver function tests were ordered today.  #3 prescription for antifungal nail lacquer dispensed [facility] #4 prescription for Lamisil antifungal dispensed today 90 days  #5 patient is to return to clinic in 4 months for follow-up evaluation   Dr. Felecia ShellingBrent M. Hema Lanza, DPM [facility]

## 2016-04-25 NOTE — Patient Instructions (Signed)
Fungal Nail  A fungal infection of the nail (tinea unguium/onychomycosis) is common. It is common as the visible part of the nail is composed of dead cells which have no blood supply to help prevent infection. It occurs because fungi are everywhere and will pick any opportunity to grow on any dead material. Because nails are very slow growing they require up to 2 years of treatment with anti-fungal medications. The entire nail back to the base is infected. This includes approximately  of the nail which you cannot see. If your caregiver has prescribed a medication by mouth, take it every day and as directed. No progress will be seen for at least 6 to 9 months. Do not be disappointed! Because fungi live on dead cells with little or no exposure to blood supply, medication delivery to the infection is slow; thus the cure is slow. It is also why you can observe no progress in the first 6 months. The nail becoming cured is the base of the nail, as it has the blood supply. Topical medication such as creams and ointments are usually not effective. Important in successful treatment of nail fungus is closely following the medication regimen that your doctor prescribes. Sometimes you and your caregiver may elect to speed up this process by surgical removal of all the nails. Even this may still require 6 to 9 months of additional oral medications. See your caregiver as directed. Remember there will be no visible improvement for at least 6 months. See your caregiver sooner if other signs of infection (redness and swelling) develop.   This information is not intended to replace advice given to you by your health care provider. Make sure you discuss any questions you have with your health care provider.   Document Released: 06/27/2000 Document Revised: 11/14/2014 Document Reviewed: 01/01/2015 Elsevier Interactive Patient Education 2016 Elsevier Inc.  

## 2016-04-26 LAB — HEPATIC FUNCTION PANEL
ALK PHOS: 92 IU/L (ref 39–117)
ALT: 27 IU/L (ref 0–44)
AST: 21 IU/L (ref 0–40)
Albumin: 4.3 g/dL (ref 3.6–4.8)
BILIRUBIN TOTAL: 0.7 mg/dL (ref 0.0–1.2)
Bilirubin, Direct: 0.17 mg/dL (ref 0.00–0.40)
Total Protein: 6.5 g/dL (ref 6.0–8.5)

## 2016-05-09 ENCOUNTER — Other Ambulatory Visit: Payer: Self-pay | Admitting: *Deleted

## 2016-08-29 ENCOUNTER — Ambulatory Visit (INDEPENDENT_AMBULATORY_CARE_PROVIDER_SITE_OTHER): Payer: Medicare Other | Admitting: Podiatry

## 2016-08-29 DIAGNOSIS — B351 Tinea unguium: Secondary | ICD-10-CM | POA: Diagnosis not present

## 2016-08-29 MED ORDER — TERBINAFINE HCL 250 MG PO TABS: 250.0000 mg | ORAL_TABLET | Freq: Every day | ORAL | 0 refills | Status: DC

## 2016-08-29 NOTE — Progress Notes (Signed)
Subjective: Patient presents today for follow-up treatment of bilateral toenail fungus. Patient states that he took the Lamisil for only 30 days. He didn't realize it was a 90 day course of oral Lamisil. Patient states that he did notice some improvement for the toenails however it started to become discolored again after he discontinued his Lamisil. Patient presents today for follow-up treatment and evaluation.  Objective: Physical Exam General: The patient is alert and oriented x3 in no acute distress.  Dermatology: Hyperkeratotic, discolored, thickened, onychodystrophy of nails noted bilaterally.  Skin is warm, dry and supple bilateral lower extremities. Negative for open lesions or macerations.  Vascular: Palpable pedal pulses bilaterally. No edema or erythema noted. Capillary refill within normal limits.  Neurological: Epicritic and protective threshold grossly intact bilaterally.   Musculoskeletal Exam: Range of motion within normal limits to all pedal and ankle joints bilateral. Muscle strength 5/5 in all groups bilateral.   Assessment: #1 onychomycosis bilateral great toenails  Plan of Care:  #1 Patient was evaluated. #2 today were going to re-prescribe terbinafine 250 mg #60. #3 continue topical antifungal nail lacquer through [facility] #4 return to clinic in 4 months  Felecia ShellingBrent M. Natalya Domzalski, DPM [facility]  Dr. Felecia ShellingBrent M. Dacy Enrico, DPM    [address]                                        [place], [place] [address]                Office [phone]  Fax [phone]

## 2016-12-23 ENCOUNTER — Ambulatory Visit (INDEPENDENT_AMBULATORY_CARE_PROVIDER_SITE_OTHER): Payer: Medicare Other | Admitting: Podiatry

## 2016-12-23 ENCOUNTER — Encounter: Payer: Self-pay | Admitting: Podiatry

## 2016-12-23 ENCOUNTER — Ambulatory Visit: Payer: Medicare Other | Admitting: Podiatry

## 2016-12-23 DIAGNOSIS — B351 Tinea unguium: Secondary | ICD-10-CM

## 2016-12-31 NOTE — Progress Notes (Signed)
Subjective: Patient presents today for follow-up treatment of bilateral toenail fungus. He states his symptoms have improved. He reports completing the course of Lamisil. He has no new complaints at this time.  Objective: Physical Exam General: The patient is alert and oriented x3 in no acute distress.  Dermatology: Hyperkeratotic, discolored, thickened, onychodystrophy of nails noted bilaterally.  Skin is warm, dry and supple bilateral lower extremities. Negative for open lesions or macerations.  Vascular: Palpable pedal pulses bilaterally. No edema or erythema noted. Capillary refill within normal limits.  Neurological: Epicritic and protective threshold grossly intact bilaterally.   Musculoskeletal Exam: Range of motion within normal limits to all pedal and ankle joints bilateral. Muscle strength 5/5 in all groups bilateral.   Assessment: #1 onychomycosis bilateral great toenails  Plan of Care:  #1 Patient was evaluated. #2 continue topical antifungal nail lacquer. #3 patient completed Lamisil. #4 return to clinic when necessary.   Felecia ShellingBrent M. Wylee Ogden, DPM [facility]  Dr. Felecia ShellingBrent M. Jantz Main, DPM    [address]                                        [place], [place] [address]                Office [phone]  Fax [phone]

## 2018-04-14 ENCOUNTER — Ambulatory Visit
Admission: RE | Admit: 2018-04-14 | Discharge: 2018-04-14 | Disposition: A | Discharge status: Home / Self Care | Payer: Medicare Other | Source: Ambulatory Visit | Attending: Urology | Admitting: Urology

## 2018-04-14 ENCOUNTER — Encounter: Payer: Self-pay | Admitting: Urology

## 2018-04-14 ENCOUNTER — Ambulatory Visit (INDEPENDENT_AMBULATORY_CARE_PROVIDER_SITE_OTHER): Payer: Medicare Other | Admitting: Urology

## 2018-04-14 VITALS — BP 122/67 | HR 76 | Ht 66.0 in | Wt 211.1 lb

## 2018-04-14 DIAGNOSIS — N200 Calculus of kidney: Secondary | ICD-10-CM

## 2018-04-14 DIAGNOSIS — Z87898 Personal history of other specified conditions: Secondary | ICD-10-CM

## 2018-04-14 LAB — MICROSCOPIC EXAMINATION
BACTERIA UA: NONE SEEN
Epithelial Cells (non renal): NONE SEEN /hpf (ref 0–10)
WBC, UA: NONE SEEN /hpf (ref 0–5)

## 2018-04-14 LAB — URINALYSIS, COMPLETE
Bilirubin, UA: NEGATIVE
Ketones, UA: NEGATIVE
LEUKOCYTES UA: NEGATIVE
NITRITE UA: NEGATIVE
Protein, UA: NEGATIVE
Specific Gravity, UA: 1.015 (ref 1.005–1.030)
Urobilinogen, Ur: 0.2 mg/dL (ref 0.2–1.0)
pH, UA: 6 (ref 5.0–7.5)

## 2018-04-14 NOTE — Progress Notes (Signed)
04/14/2018 8:54 AM   Devon Valencia 10-10-1950 [id number]  Referring provider: Kirk Ruths, MD [place] [facility] [place], [place] [address]  Chief Complaint  Patient presents with  . Nephrolithiasis   Urologic history:  1. Bilateral nephrolithiasis  2.  History elevated PSA -Previous biopsy around 2010 which was benign.  PSA at time of biopsy unknown; subsequent PSAs normal and stable  3.  BPH with mild lower urinary tract symptoms  HPI: 67 year old male presents to establish local urologic care.  He was previously followed at my former practice in [place] and has most recently seen Dr. Jacqlyn Larsen at [facility].  He last saw him in March 2019.  KUB at that time showed stable, bilateral nephrolithiasis.  A PSA in January 2019 at [facility] was stable at 1.77.  He has mild lower urinary tract symptoms including nocturia x1-2 and intermittent urinary stream.  Denies dysuria or gross hematuria.  Denies flank, abdominal, pelvic or scrotal pain.   PMH: Past Medical History:  Diagnosis Date  . Arthritis   . Chronic kidney disease   . Diabetes mellitus without complication ([place])   . GERD (gastroesophageal reflux disease)   . Glaucoma   . Hypercholesteremia   . Hypertension   . Nephrolithiasis   . Sleep apnea     Surgical History: Past Surgical History:  Procedure Laterality Date  . COLONOSCOPY WITH PROPOFOL N/A 12/24/2015   Procedure: COLONOSCOPY WITH PROPOFOL;  Surgeon: Lollie Sails, MD;  Location: [facility] ENDOSCOPY;  Service: Endoscopy;  Laterality: N/A;  . fish bone removal     from throat  . TONSILLECTOMY    . WRIST SURGERY Right    Tendonitis    Home Medications:  Allergies as of 04/14/2018      Reactions   Lisinopril Other (See Comments), Cough, Nausea And Vomiting   cough cough      Medication List        Accurate as of 04/14/18  8:54 AM. Always use your most recent med list.          aspirin EC 81 MG tablet Take by  mouth.   ASSURE COMFORT LANCETS 28G Misc 1 each by Other route 2 (two) times daily Use 1 each twice daily as directed. DX E 11.65   Azelastine-Fluticasone 137-50 MCG/ACT Susp Reported on 12/24/2015   brimonidine-timolol 0.2-0.5 % ophthalmic solution Commonly known as:  COMBIGAN   FARXIGA 10 MG Tabs tablet Generic drug:  dapagliflozin propanediol TAKE ONE TABLET BY MOUTH EVERY MORNING   FIFTY50 GLUCOSE METER 2.0 w/Device Kit Use as directed ONE TOUCH ULTRA E11.22   glucose blood test strip Use 1 strip via meter twice daily as directed. Dx. E11.65   losartan 50 MG tablet Commonly known as:  COZAAR Take by mouth.   metFORMIN 500 MG 24 hr tablet Commonly known as:  GLUCOPHAGE-XR Take by mouth.   Naftifine HCl 2 % Crea APPLY TO AFFECTED AREA TWICE A DAY   NONFORMULARY OR COMPOUNDED ITEM Onychomycosis Nail [facility]  2 refills   ondansetron 4 MG tablet Commonly known as:  ZOFRAN Take 4 mg by mouth every 8 (eight) hours as needed for nausea or vomiting.   rosuvastatin 20 MG tablet Commonly known as:  CRESTOR Take by mouth.   TRULICITY 1.5 KX/3.8HW Sopn Generic drug:  Dulaglutide Inject 1.5 mg into the skin every 7 (seven) days.       Allergies:  Allergies  Allergen Reactions  . Lisinopril Other (See  Comments), Cough and Nausea And Vomiting    cough cough    Family History: No family history on file.  Social History:  reports that he has never smoked. He has never used smokeless tobacco. He reports that he does not drink alcohol or use drugs.  ROS: UROLOGY Frequent Urination?: No Hard to postpone urination?: No Burning/pain with urination?: No Get up at night to urinate?: Yes Leakage of urine?: No Urine stream starts and stops?: Yes Trouble starting stream?: No Do you have to strain to urinate?: No Blood in urine?: No Urinary tract infection?: No Sexually transmitted disease?: No Injury to kidneys or bladder?: No Painful  intercourse?: No Weak stream?: No Erection problems?: No Penile pain?: No  Gastrointestinal Nausea?: No Vomiting?: No Indigestion/heartburn?: No Diarrhea?: No Constipation?: No  Constitutional Fever: No Night sweats?: No Weight loss?: No Fatigue?: No  Skin Skin rash/lesions?: No Itching?: No  Eyes Blurred vision?: No Double vision?: No  Ears/Nose/Throat Sore throat?: No Sinus problems?: No  Hematologic/Lymphatic Swollen glands?: No Easy bruising?: No  Cardiovascular Leg swelling?: No Chest pain?: No  Respiratory Cough?: No Shortness of breath?: No  Endocrine Excessive thirst?: No  Musculoskeletal Back pain?: No Joint pain?: No  Neurological Headaches?: No Dizziness?: No  Psychologic Depression?: No Anxiety?: No  Physical Exam: BP 122/67 (BP Location: Left Arm, Patient Position: Sitting, Cuff Size: Large)   Pulse 76   Ht 5' 6" (1.676 m)   Wt 211 lb 1.6 oz (95.8 kg)   BMI 34.07 kg/m   Constitutional:  Alert and oriented, No acute distress. HEENT: [place] AT, moist mucus membranes.  Trachea midline, no masses. Cardiovascular: No clubbing, cyanosis, or edema. Respiratory: Normal respiratory effort, no increased work of breathing. GI: Abdomen is soft, nontender, nondistended, no abdominal masses GU: No CVA tenderness Lymph: No cervical or inguinal lymphadenopathy. Skin: No rashes, bruises or suspicious lesions. Neurologic: Grossly intact, no focal deficits, moving all 4 extremities. Psychiatric: Normal mood and affect.   Assessment & Plan:   67 year old male with bilateral, nonobstructing renal calculi.  A KUB was ordered today and he will be notified with results.  If no significant change he will follow-up annually or as needed for symptoms of renal colic.  Urinalysis today was unremarkable.   Return in about 1 year (around 04/15/2019) for Recheck, KUB.  Abbie Sons, [place] [address] [place], [place] [address] [phone]

## 2018-04-16 ENCOUNTER — Telehealth: Payer: Self-pay | Admitting: Family Medicine

## 2018-04-16 NOTE — Telephone Encounter (Signed)
Patient notified

## 2018-04-16 NOTE — Telephone Encounter (Signed)
-----   Message from [name], MD sent at 04/16/2018  8:17 AM EDT ----- KUB shows stable, bilateral nephrolithiasis

## 2019-04-15 ENCOUNTER — Ambulatory Visit: Payer: Medicare Other | Admitting: Urology

## 2019-04-24 ENCOUNTER — Emergency Department: Payer: Medicare Other

## 2019-04-24 ENCOUNTER — Emergency Department
Admission: EM | Admit: 2019-04-24 | Discharge: 2019-04-24 | Disposition: A | Discharge status: Home / Self Care | Payer: Medicare Other | Attending: Emergency Medicine | Admitting: Emergency Medicine

## 2019-04-24 ENCOUNTER — Other Ambulatory Visit: Payer: Self-pay

## 2019-04-24 DIAGNOSIS — E1169 Type 2 diabetes mellitus with other specified complication: Secondary | ICD-10-CM | POA: Insufficient documentation

## 2019-04-24 DIAGNOSIS — N200 Calculus of kidney: Secondary | ICD-10-CM | POA: Insufficient documentation

## 2019-04-24 DIAGNOSIS — N189 Chronic kidney disease, unspecified: Secondary | ICD-10-CM | POA: Diagnosis not present

## 2019-04-24 DIAGNOSIS — I129 Hypertensive chronic kidney disease with stage 1 through stage 4 chronic kidney disease, or unspecified chronic kidney disease: Secondary | ICD-10-CM | POA: Insufficient documentation

## 2019-04-24 DIAGNOSIS — Z7982 Long term (current) use of aspirin: Secondary | ICD-10-CM | POA: Insufficient documentation

## 2019-04-24 DIAGNOSIS — Z79899 Other long term (current) drug therapy: Secondary | ICD-10-CM | POA: Insufficient documentation

## 2019-04-24 DIAGNOSIS — E785 Hyperlipidemia, unspecified: Secondary | ICD-10-CM | POA: Diagnosis not present

## 2019-04-24 DIAGNOSIS — N201 Calculus of ureter: Secondary | ICD-10-CM | POA: Insufficient documentation

## 2019-04-24 DIAGNOSIS — Z7984 Long term (current) use of oral hypoglycemic drugs: Secondary | ICD-10-CM | POA: Diagnosis not present

## 2019-04-24 DIAGNOSIS — R1032 Left lower quadrant pain: Secondary | ICD-10-CM | POA: Diagnosis present

## 2019-04-24 LAB — COMPREHENSIVE METABOLIC PANEL
ALT: 34 U/L (ref 0–44)
AST: 27 U/L (ref 15–41)
Albumin: 4.5 g/dL (ref 3.5–5.0)
Alkaline Phosphatase: 92 U/L (ref 38–126)
Anion gap: 11 (ref 5–15)
BUN: 18 mg/dL (ref 8–23)
CO2: 23 mmol/L (ref 22–32)
Calcium: 9.8 mg/dL (ref 8.9–10.3)
Chloride: 106 mmol/L (ref 98–111)
Creatinine, Ser: 1.83 mg/dL — ABNORMAL HIGH (ref 0.61–1.24)
GFR calc Af Amer: 43 mL/min — ABNORMAL LOW (ref >60)
GFR calc non Af Amer: 37 mL/min — ABNORMAL LOW (ref >60)
Glucose, Bld: 180 mg/dL — ABNORMAL HIGH (ref 70–99)
Potassium: 4.2 mmol/L (ref 3.5–5.1)
Sodium: 140 mmol/L (ref 135–145)
Total Bilirubin: 0.9 mg/dL (ref 0.3–1.2)
Total Protein: 7.3 g/dL (ref 6.5–8.1)

## 2019-04-24 LAB — URINALYSIS, ROUTINE W REFLEX MICROSCOPIC
Bacteria, UA: NONE SEEN
Bilirubin Urine: NEGATIVE
Glucose, UA: >=500 mg/dL — AB
Hgb urine dipstick: NEGATIVE
Ketones, ur: NEGATIVE mg/dL
Leukocytes,Ua: NEGATIVE
Nitrite: NEGATIVE
Protein, ur: NEGATIVE mg/dL
Specific Gravity, Urine: 1.017 (ref 1.005–1.030)
WBC, UA: NONE SEEN WBC/hpf (ref 0–5)
pH: 6 (ref 5.0–8.0)

## 2019-04-24 LAB — CBC
HCT: 50.9 % (ref 39.0–52.0)
Hemoglobin: 16.1 g/dL (ref 13.0–17.0)
MCH: 27.6 pg (ref 26.0–34.0)
MCHC: 31.6 g/dL (ref 30.0–36.0)
MCV: 87.2 fL (ref 80.0–100.0)
Platelets: 204 10*3/uL (ref 150–400)
RBC: 5.84 MIL/uL — ABNORMAL HIGH (ref 4.22–5.81)
RDW: 14.1 % (ref 11.5–15.5)
WBC: 7.2 10*3/uL (ref 4.0–10.5)
nRBC: 0 % (ref 0.0–0.2)

## 2019-04-24 MED ORDER — OXYCODONE-ACETAMINOPHEN 5-325 MG PO TABS
2.0000 | ORAL_TABLET | Freq: Once | ORAL | Status: DC
  Filled 2019-04-24: qty 2

## 2019-04-24 MED ORDER — ACETAMINOPHEN 325 MG PO TABS: 650.0000 mg | ORAL_TABLET | ORAL | 0 refills | Status: AC | PRN

## 2019-04-24 MED ORDER — OXYCODONE HCL 5 MG PO TABS: 5.0000 mg | ORAL_TABLET | Freq: Four times a day (QID) | ORAL | 0 refills | Status: DC | PRN

## 2019-04-24 MED ORDER — ACETAMINOPHEN 325 MG PO TABS: 650.0000 mg | ORAL_TABLET | ORAL | 0 refills | Status: DC | PRN

## 2019-04-24 MED ORDER — OXYCODONE-ACETAMINOPHEN 5-325 MG PO TABS
1.0000 | ORAL_TABLET | Freq: Once | ORAL | Status: AC
  Administered 2019-04-24: 13:00:00 1 via ORAL

## 2019-04-24 MED ORDER — SODIUM CHLORIDE 0.9 % IV BOLUS
1000.0000 mL | Freq: Once | INTRAVENOUS | Status: AC
  Administered 2019-04-24: 1000 mL via INTRAVENOUS

## 2019-04-24 MED ORDER — ONDANSETRON 4 MG PO TBDP: 4.0000 mg | ORAL_TABLET | Freq: Three times a day (TID) | ORAL | 0 refills | Status: DC | PRN

## 2019-04-24 MED ORDER — ONDANSETRON HCL 4 MG/2ML IJ SOLN
4.0000 mg | Freq: Once | INTRAMUSCULAR | Status: AC
  Administered 2019-04-24: 4 mg via INTRAVENOUS
  Filled 2019-04-24: qty 2

## 2019-04-24 MED ORDER — KETOROLAC TROMETHAMINE 30 MG/ML IJ SOLN
30.0000 mg | Freq: Once | INTRAMUSCULAR | Status: AC
  Administered 2019-04-24: 11:00:00 30 mg via INTRAVENOUS
  Filled 2019-04-24: qty 1

## 2019-04-24 NOTE — ED Triage Notes (Signed)
First Nurse Note:  C/O left flank pain and hematuria since 1 day.

## 2019-04-24 NOTE — ED Notes (Signed)
Patient given graham crackers, peanut butter and diet gingerale per request.  Will continue to monitor.

## 2019-04-24 NOTE — ED Provider Notes (Signed)
[facility] Emergency Department Provider Note  ____________________________________________   First MD Initiated Contact with Patient 04/24/19 1054     (approximate)  I have reviewed the triage vital signs and the nursing notes.   HISTORY  Chief Complaint Flank Pain    HPI Devon Valencia is a 68 y.o. male with past medical history as below here with flank pain.  The patient states his symptoms started yesterday.  States he had no pain, but began to have gross hematuria in his urine.  He then began to feel some mild frequency overnight.  This morning, around 2 AM, he experienced acute onset of severe, aching, gnawing, left flank pain.  Since then, the pain is been constant, but is intermittently worse.  He has had associated nausea but no vomiting.  No diarrhea.  No constipation.  No other medical complaints.  No fever chills.       Past Medical History:  Diagnosis Date  . Arthritis   . Chronic kidney disease   . Diabetes mellitus without complication (HCC)   . GERD (gastroesophageal reflux disease)   . Glaucoma   . Hypercholesteremia   . Hypertension   . Nephrolithiasis   . Sleep apnea     Patient Active Problem List   Diagnosis Date Noted  . OSA on CPAP 11/19/2015  . Health care maintenance 05/21/2015  . Peripheral blood vessel disorder (HCC) 05/02/2014  . Diabetes mellitus, type 2 (HCC) 05/02/2014  . Essential (primary) hypertension 03/07/2014  . HLD (hyperlipidemia) 03/05/2014  . Hyperlipidemia due to type 2 diabetes mellitus (HCC) 03/05/2014  . Benign prostatic hyperplasia with urinary obstruction 06/20/2013  . Urgency of micturation 06/20/2013  . Nodular prostate with urinary obstruction 06/20/2013  . Calculus of kidney 06/18/2012  . Glucose found in urine on examination 06/18/2012  . Elevated PSA 06/18/2012    Past Surgical History:  Procedure Laterality Date  . COLONOSCOPY WITH PROPOFOL N/A 12/24/2015   Procedure: COLONOSCOPY  WITH PROPOFOL;  Surgeon: Christena DeemMartin U Skulskie, MD;  [facility];  Service: Endoscopy;  Laterality: N/A;  . fish bone removal     from throat  . TONSILLECTOMY    . WRIST SURGERY Right    Tendonitis    Prior to Admission medications   Medication Sig Start Date End Date Taking? Authorizing Provider  acetaminophen (TYLENOL) 325 MG tablet Take 2 tablets (650 mg total) by mouth every 4 (four) hours as needed for moderate pain. 04/24/19 04/23/20  Shaune PollackIsaacs, Tyshea Imel, MD  aspirin EC 81 MG tablet Take by mouth. 06/18/12   [provider]  ASSURE COMFORT LANCETS 28G MISC 1 each by Other route 2 (two) times daily Use 1 each twice daily as directed. DX E 11.65 02/17/18   [provider]  Azelastine-Fluticasone 137-50 MCG/ACT SUSP Reported on 12/24/2015    [provider]  brimonidine-timolol (COMBIGAN) 0.2-0.5 % ophthalmic solution  06/07/13   [provider]  dapagliflozin propanediol (FARXIGA) 10 MG TABS tablet TAKE ONE TABLET BY MOUTH EVERY MORNING 04/13/18   [provider]  Dulaglutide (TRULICITY) 1.5 MG/0.5ML SOPN Inject 1.5 mg into the skin every 7 (seven) days.    [provider]  glucose blood test strip Use 1 strip via meter twice daily as directed. Dx. E11.65 02/17/18   [provider]  losartan (COZAAR) 50 MG tablet Take by mouth. 03/07/14   [provider]  metFORMIN (GLUCOPHAGE-XR) 500 MG 24 hr tablet Take by mouth. 02/17/18   [provider]  Naftifine HCl 2 % CREA APPLY TO AFFECTED AREA TWICE A DAY 02/04/16   Hyatt, Max T, DPM  NONFORMULARY OR COMPOUNDED ITEM Onychomycosis [facility]  2 refills 04/25/16   [provider]  ondansetron (ZOFRAN ODT) 4 MG disintegrating tablet Take 1 tablet (4 mg total) by mouth every 8 (eight) hours as needed for nausea or vomiting. 04/24/19   Shaune Pollack, MD  ondansetron (ZOFRAN) 4 MG tablet Take 4 mg by mouth every 8 (eight) hours as needed for  nausea or vomiting.    [provider]  oxyCODONE (ROXICODONE) 5 MG immediate release tablet Take 1-2 tablets (5-10 mg total) by mouth every 6 (six) hours as needed for moderate pain or severe pain. 04/24/19 04/23/20  Shaune Pollack, MD  rosuvastatin (CRESTOR) 20 MG tablet Take by mouth. 02/10/18   [provider]    Allergies Lisinopril  No family history on file.  Social History Social History   Tobacco Use  . Smoking status: Never Smoker  . Smokeless tobacco: Never Used  Substance Use Topics  . Alcohol use: No    Alcohol/week: 0.0 standard drinks  . Drug use: No    Review of Systems  Review of Systems  Constitutional: Positive for fatigue. Negative for chills and fever.  HENT: Negative for sore throat.   Respiratory: Negative for shortness of breath.   Cardiovascular: Negative for chest pain.  Gastrointestinal: Positive for nausea. Negative for abdominal pain.  Genitourinary: Positive for flank pain and hematuria.  Musculoskeletal: Negative for neck pain.  Skin: Negative for rash and wound.  Allergic/Immunologic: Negative for immunocompromised state.  Neurological: Negative for weakness and numbness.  Hematological: Does not bruise/bleed easily.  All other systems reviewed and are negative.    ____________________________________________  PHYSICAL EXAM:      VITAL SIGNS: ED Triage Vitals [04/24/19 1034]  Enc Vitals Group     BP (!) 157/96     Pulse Rate 85     Resp 16     Temp 97.8 F (36.6 C)     Temp Source Oral     SpO2 99 %     Weight 223 lb (101.2 kg)     Height  (1.676 m)     Head Circumference      Peak Flow      Pain Score 8     Pain Loc      Pain Edu?      Excl. in GC?      Physical Exam Vitals signs and nursing note reviewed.  Constitutional:      General: He is not in acute distress.    Appearance: He is well-developed.  HENT:     Head: Normocephalic and atraumatic.  Eyes:     Conjunctiva/sclera: Conjunctivae  normal.  Neck:     Musculoskeletal: Neck supple.  Cardiovascular:     Rate and Rhythm: Normal rate and regular rhythm.     Heart sounds: Normal heart sounds. No murmur. No friction rub.  Pulmonary:     Effort: Pulmonary effort is normal. No respiratory distress.     Breath sounds: Normal breath sounds. No wheezing or rales.  Abdominal:     General: There is no distension.     Palpations: Abdomen is soft.     Tenderness: There is no abdominal tenderness.  Skin:    General: Skin is warm.     Capillary Refill: Capillary refill takes less than 2 seconds.  Neurological:     Mental Status:  He is alert and oriented to person, place, and time.     Motor: No abnormal muscle tone.       ____________________________________________   LABS (all labs ordered are listed, but only abnormal results are displayed)  Labs Reviewed  COMPREHENSIVE METABOLIC PANEL - Abnormal; Notable for the following components:      Result Value   Glucose, Bld 180 (*)    Creatinine, Ser 1.83 (*)    GFR calc non Af Amer 37 (*)    GFR calc Af Amer 43 (*)    All other components within normal limits  CBC - Abnormal; Notable for the following components:   RBC 5.84 (*)    All other components within normal limits  URINALYSIS, ROUTINE W REFLEX MICROSCOPIC - Abnormal; Notable for the following components:   Color, Urine YELLOW (*)    APPearance CLEAR (*)    Glucose, UA >=500 (*)    All other components within normal limits    ____________________________________________  EKG: None ________________________________________  RADIOLOGY All imaging, including plain films, CT scans, and ultrasounds, independently reviewed by me, and interpretations confirmed via formal radiology reads.  ED MD interpretation:   CT Stone: L sided ureteric stone (4 mm), otherwise negative  Official radiology report(s): Ct Renal Stone Study  Result Date: 04/24/2019 CLINICAL DATA:  Left flank pain, beginning yesterday.  Hematuria. EXAM: CT ABDOMEN AND PELVIS WITHOUT CONTRAST TECHNIQUE: Multidetector CT imaging of the abdomen and pelvis was performed following the standard protocol without IV contrast. COMPARISON:  10/02/2010 FINDINGS: Lower chest: Clear lung bases. Borderline cardiomegaly. Lad and right coronary artery atherosclerosis. Hepatobiliary: Mild hepatic steatosis. Normal gallbladder, without biliary ductal dilatation. Pancreas: Fatty replacement involving a portion of the pancreatic uncinate process including on 37/2. No pancreatic duct dilatation or acute inflammation. Spleen: Normal in size, without focal abnormality. Adrenals/Urinary Tract: Left adrenal thickening. Normal right adrenal. Mild renal cortical thinning bilaterally. Bilateral renal collecting system calculi, larger on the left. Maximally 1.1 cm in the inter/lower pole left kidney. Minimal caliectasis and hydroureter to the level of a 4 mm proximal left ureteric stone on 49/2. No bladder calculi. Stomach/Bowel: s Normal stomach, without wall thickening. Scattered colonic diverticula. Normal terminal ileum and appendix. Normal small bowel. Vascular/Lymphatic: Aortic atherosclerosis. No abdominopelvic adenopathy. Reproductive: Mild to moderate prostatomegaly. Other: No significant free fluid. Periumbilical tiny area of ventral abdominal wall laxity containing fat. 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 IMPRESSION: 1. Mild left-sided urinary tract obstruction secondary to a proximal left ureteric stone. 2. Bilateral nephrolithiasis. 3. Hepatic steatosis. 4. Coronary artery atherosclerosis. 5. Prostatomegaly. 6. Aortic Atherosclerosis (ICD10-I70.0). Electronically Signed   By: Jeronimo Greaves M.D.   On: 04/24/2019 12:58    ____________________________________________  PROCEDURES   Procedure(s) performed (including Critical Care):  Procedures  ____________________________________________  INITIAL IMPRESSION  / MDM / ASSESSMENT AND PLAN / ED COURSE  As part of my medical decision making, I reviewed the following data within the electronic MEDICAL RECORD NUMBER Notes from prior ED visits and [place] Controlled Substance Database      *Devon Valencia was evaluated in Emergency Department on 04/24/2019 for the symptoms described in the history of present illness. He was evaluated in the context of the global COVID-19 pandemic, which necessitated consideration that the patient might be at risk for infection with the SARS-CoV-2 virus that causes COVID-19. Institutional protocols and algorithms that pertain to the evaluation of patients at risk for COVID-19 are in a state of rapid change  based on information released by regulatory bodies including the CDC and federal and state organizations. These policies and algorithms were followed during the patient's care in the ED.  Some ED evaluations and interventions may be delayed as a result of limited staffing during the pandemic.*      Medical Decision Making:  68 yo M here with flank pain 2/2 4 mm proximal stone. UA without signs of UTI, no fever, no sx to suggest infected stone. Pt is otherwise very well appearing, pain improved w/ toradol. IVF given. Will d/c with analgesia, outpt follow-up with Urology.  ____________________________________________  FINAL CLINICAL IMPRESSION(S) / ED DIAGNOSES  Final diagnoses:  Ureteral stone  Nephrolithiasis     MEDICATIONS GIVEN DURING THIS VISIT:  Medications  ketorolac (TORADOL) 30 MG/ML injection 30 mg (30 mg Intravenous Given 04/24/19 1050)  sodium chloride 0.9 % bolus 1,000 mL (0 mLs Intravenous Stopped 04/24/19 1358)  ondansetron (ZOFRAN) injection 4 mg (4 mg Intravenous Given 04/24/19 1319)  oxyCODONE-acetaminophen (PERCOCET/ROXICET) 5-325 MG per tablet 1 tablet (1 tablet Oral Given 04/24/19 1319)     ED Discharge Orders         Ordered    acetaminophen (TYLENOL) 325 MG tablet  Every 4 hours PRN,   Status:   Discontinued     04/24/19 1351    oxyCODONE (ROXICODONE) 5 MG immediate release tablet  Every 6 hours PRN,   Status:  Discontinued     04/24/19 1351    ondansetron (ZOFRAN ODT) 4 MG disintegrating tablet  Every 8 hours PRN,   Status:  Discontinued     04/24/19 1351    oxyCODONE (ROXICODONE) 5 MG immediate release tablet  Every 6 hours PRN     04/24/19 1352    ondansetron (ZOFRAN ODT) 4 MG disintegrating tablet  Every 8 hours PRN     04/24/19 1352    acetaminophen (TYLENOL) 325 MG tablet  Every 4 hours PRN     04/24/19 1352           Note:  This document was prepared using Dragon voice recognition software and may include unintentional dictation errors.   Duffy Bruce, MD 04/24/19 2021

## 2019-04-24 NOTE — ED Triage Notes (Signed)
Pt reports left flank pain intermittently that became worse yesterday and has noticed intermittent dark urine and pink tinged urine, denies fever, but states now the left flank pain is persistent

## 2019-04-24 NOTE — Discharge Instructions (Addendum)
I would recommend taking Tylenol every 4-6 hours for the next 24 hours.  You can then start taking it as needed for mild pain.  I would also recommend taking the oxycodone 5 mg, 1 tablet, every 6 hours for the next 24 hours.  If pain worsens, you can take up to 2 tablets every 6 hours. You can then take this as needed.  Drink plenty of fluid.

## 2019-04-26 ENCOUNTER — Ambulatory Visit: Payer: Medicare Other | Admitting: Urology

## 2019-04-26 ENCOUNTER — Other Ambulatory Visit: Payer: Self-pay

## 2019-04-26 ENCOUNTER — Telehealth: Payer: Self-pay | Admitting: Physician Assistant

## 2019-04-26 ENCOUNTER — Ambulatory Visit: Payer: Medicare Other | Admitting: Physician Assistant

## 2019-04-26 DIAGNOSIS — N200 Calculus of kidney: Secondary | ICD-10-CM

## 2019-04-26 NOTE — Telephone Encounter (Signed)
Pt had called and states that he is in a lot of pain from kidney stones and also constipation. I had scheduled an appt for him to see Sam at 2pm today but after speaking to her about his condition she recommends taking 2 oxycodones and starting Senna. I called pt back to cancel his appt and also spoke with him about Sam's recommendations, he is already taking Senna and will keep his appt with [facility] for 04/27/2019

## 2019-04-27 ENCOUNTER — Ambulatory Visit
Admission: RE | Admit: 2019-04-27 | Discharge: 2019-04-27 | Disposition: A | Discharge status: Home / Self Care | Payer: Medicare Other | Source: Ambulatory Visit | Attending: Urology | Admitting: Urology

## 2019-04-27 ENCOUNTER — Ambulatory Visit (INDEPENDENT_AMBULATORY_CARE_PROVIDER_SITE_OTHER): Payer: Medicare Other | Admitting: Urology

## 2019-04-27 ENCOUNTER — Other Ambulatory Visit: Payer: Self-pay

## 2019-04-27 ENCOUNTER — Encounter: Payer: Self-pay | Admitting: Urology

## 2019-04-27 VITALS — BP 188/82 | HR 80 | Ht 66.0 in | Wt 210.0 lb

## 2019-04-27 DIAGNOSIS — N200 Calculus of kidney: Secondary | ICD-10-CM

## 2019-04-27 DIAGNOSIS — N23 Unspecified renal colic: Secondary | ICD-10-CM | POA: Diagnosis not present

## 2019-04-27 DIAGNOSIS — N201 Calculus of ureter: Secondary | ICD-10-CM | POA: Diagnosis not present

## 2019-04-27 NOTE — Progress Notes (Signed)
04/27/2019 10:35 AM   Cleda ClarksGeorge Ketter 12/19/1950 [id number]  Referring provider: Lauro RegulusAnderson, Marshall W, MD [address] [facility] [place] [id number]  Chief Complaint  Patient presents with  . Nephrolithiasis    Urologic history:   1. Bilateral nephrolithiasis  2.  History elevated PSA -Previous biopsy around 2010 which was benign.  PSA at time of biopsy unknown; subsequent PSAs normal and stable  3.  BPH with mild lower urinary tract symptoms   HPI: 68 y.o. male was scheduled for annual follow-up however he presented to the ED on 04/24/2019 with a 24-hour history of intermittent gross hematuria and subsequent onset of severe left flank pain.  There were no identifiable precipitating, aggravating or alleviating factors.  He had nausea without vomiting.  Denied fever or chills.  Stone protocol CT was performed which showed bilateral nephrolithiasis and a 4 mm left proximal ureteral calculus with mild hydronephrosis.  His pain was controlled with parenteral analgesics and he was discharged on oxycodone, Zofran.  He was not placed on tamsulosin.  Since his ED visit he has had intermittent pain.  He has developed significant constipation which has been refractory to Metamucil, MiraLAX and Senokot.   PMH: Past Medical History:  Diagnosis Date  . Arthritis   . Chronic kidney disease   . Diabetes mellitus without complication (HCC)   . GERD (gastroesophageal reflux disease)   . Glaucoma   . Hypercholesteremia   . Hypertension   . Nephrolithiasis   . Sleep apnea     Surgical History: Past Surgical History:  Procedure Laterality Date  . COLONOSCOPY WITH PROPOFOL N/A 12/24/2015   Procedure: COLONOSCOPY WITH PROPOFOL;  Surgeon: Christena DeemMartin U Skulskie, MD;  [facility];  Service: Endoscopy;  Laterality: N/A;  . fish bone removal     from throat  . TONSILLECTOMY    . WRIST SURGERY Right    Tendonitis    Home Medications:  Allergies as  of 04/27/2019      Reactions   Lisinopril Other (See Comments), Cough, Nausea And Vomiting   cough cough      Medication List       Accurate as of April 27, 2019 10:35 AM. If you have any questions, ask your nurse or doctor.        STOP taking these medications   aspirin EC 81 MG tablet Stopped by: Riki AltesScott C Cyndy Braver, MD   Naftifine HCl 2 % Crea Stopped by: Riki AltesScott C Jaimen Melone, MD   NONFORMULARY OR COMPOUNDED ITEM Stopped by: Riki AltesScott C Paislie Tessler, MD     TAKE these medications   acetaminophen 325 MG tablet Commonly known as: Tylenol Take 2 tablets (650 mg total) by mouth every 4 (four) hours as needed for moderate pain.   Assure Comfort Lancets 28G Misc 1 each by Other route 2 (two) times daily Use 1 each twice daily as directed. DX E 11.65   OneTouch Delica Lancets 33G Misc USE TWICE DAILY   Azelastine-Fluticasone 137-50 MCG/ACT Susp Reported on 12/24/2015   brimonidine-timolol 0.2-0.5 % ophthalmic solution Commonly known as: COMBIGAN   Farxiga 10 MG Tabs tablet Generic drug: dapagliflozin propanediol TAKE ONE TABLET BY MOUTH EVERY MORNING   glucose blood test strip Use 1 strip via meter twice daily as directed. Dx. E11.65   losartan 50 MG tablet Commonly known as: COZAAR Take by mouth.   metFORMIN 500 MG 24 hr tablet Commonly known as: GLUCOPHAGE-XR Take by mouth.   ondansetron 4 MG disintegrating  tablet Commonly known as: Zofran ODT Take 1 tablet (4 mg total) by mouth every 8 (eight) hours as needed for nausea or vomiting.   ondansetron 4 MG tablet Commonly known as: ZOFRAN Take 4 mg by mouth every 8 (eight) hours as needed for nausea or vomiting.   oxyCODONE 5 MG immediate release tablet Commonly known as: Roxicodone Take 1-2 tablets (5-10 mg total) by mouth every 6 (six) hours as needed for moderate pain or severe pain.   rosuvastatin 20 MG tablet Commonly known as: CRESTOR Take by mouth.   Trulicity 1.5 PZ/0.2HE Sopn Generic drug: Dulaglutide  Inject 1.5 mg into the skin every 7 (seven) days.       Allergies:  Allergies  Allergen Reactions  . Lisinopril Other (See Comments), Cough and Nausea And Vomiting    cough cough    Family History: No family history on file.  Social History:  reports that he has never smoked. He has never used smokeless tobacco. He reports that he does not drink alcohol or use drugs.  ROS: UROLOGY Frequent Urination?: Yes Hard to postpone urination?: No Burning/pain with urination?: No Get up at night to urinate?: No Leakage of urine?: No Urine stream starts and stops?: Yes Trouble starting stream?: No Do you have to strain to urinate?: No Blood in urine?: Yes Urinary tract infection?: No Sexually transmitted disease?: No Injury to kidneys or bladder?: No Painful intercourse?: No Weak stream?: No Erection problems?: No Penile pain?: No  Gastrointestinal Nausea?: No Vomiting?: No Indigestion/heartburn?: No Diarrhea?: No Constipation?: Yes  Constitutional Fever: No Night sweats?: No Weight loss?: No Fatigue?: No  Skin Skin rash/lesions?: No Itching?: No  Eyes Blurred vision?: No Double vision?: No  Ears/Nose/Throat Sore throat?: No Sinus problems?: No  Hematologic/Lymphatic Swollen glands?: No Easy bruising?: No  Cardiovascular Leg swelling?: No Chest pain?: No  Respiratory Cough?: No Shortness of breath?: No  Endocrine Excessive thirst?: No  Musculoskeletal Back pain?: No Joint pain?: No  Neurological Headaches?: No Dizziness?: No  Psychologic Depression?: No Anxiety?: No  Physical Exam: BP (!) 188/82 (BP Location: Left Arm, Patient Position: Sitting, Cuff Size: Normal)   Pulse 80   Ht 5\' 6"  (1.676 m)   Wt 210 lb (95.3 kg)   BMI 33.89 kg/m   Constitutional:  Alert and oriented, No acute distress. HEENT: [place] AT, moist mucus membranes.  Trachea midline, no masses. Cardiovascular: No clubbing, cyanosis, or edema.  RRR Respiratory: Normal  respiratory effort, no increased work of breathing.  Clear GI: Abdomen is soft, nontender, nondistended, no abdominal masses GU: No CVA tenderness Lymph: No cervical or inguinal lymphadenopathy. Skin: No rashes, bruises or suspicious lesions. Neurologic: Grossly intact, no focal deficits, moving all 4 extremities. Psychiatric: Normal mood and affect.  Pertinent Imaging: CT images were personally reviewed Results for orders placed during the hospital encounter of 04/24/19  CT Renal Stone Study   Narrative CLINICAL DATA:  Left flank pain, beginning yesterday. Hematuria.  EXAM: CT ABDOMEN AND PELVIS WITHOUT CONTRAST  TECHNIQUE: Multidetector CT imaging of the abdomen and pelvis was performed following the standard protocol without IV contrast.  COMPARISON:  10/02/2010  FINDINGS: Lower chest: Clear lung bases. Borderline cardiomegaly. Lad and right coronary artery atherosclerosis.  Hepatobiliary: Mild hepatic steatosis. Normal gallbladder, without biliary ductal dilatation.  Pancreas: Fatty replacement involving a portion of the pancreatic uncinate process including on 37/2. No pancreatic duct dilatation or acute inflammation.  Spleen: Normal in size, without focal abnormality.  Adrenals/Urinary Tract: Left adrenal thickening. Normal right  adrenal.  Mild renal cortical thinning bilaterally. Bilateral renal collecting system calculi, larger on the left. Maximally 1.1 cm in the inter/lower pole left kidney.  Minimal caliectasis and hydroureter to the level of a 4 mm proximal left ureteric stone on 49/2. No bladder calculi.  Stomach/Bowel: s Normal stomach, without wall thickening. Scattered colonic diverticula. Normal terminal ileum and appendix. Normal small bowel.  Vascular/Lymphatic: Aortic atherosclerosis. No abdominopelvic adenopathy.  Reproductive: Mild to moderate prostatomegaly.  Other: No significant free fluid. Periumbilical tiny area of ventral abdominal  wall laxity containing fat.  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  IMPRESSION: 1. Mild left-sided urinary tract obstruction secondary to a proximal left ureteric stone. 2. Bilateral nephrolithiasis. 3. Hepatic steatosis. 4. Coronary artery atherosclerosis. 5. Prostatomegaly. 6. Aortic Atherosclerosis (ICD10-I70.0).   Electronically Signed   By: Jeronimo Greaves M.D.   On: 04/24/2019 12:58     Assessment & Plan:    - Left proximal ureteral calculus with renal colic He was sent for a KUB which was reviewed and the calculus is no longer present at the proximal location seen on CT.  He does have a fair amount of stool and bowel gas present.  It looks like the calculus is present between the L4/5 vertebral vertebrae.  I recommended starting tamsulosin and obtain a follow-up KUB early next week.  Surgical options were discussed including shockwave lithotripsy and ureteroscopy.  The pros and cons of each treatment were discussed.  He was informed the advantage of ureteroscopy would be his renal calculi could be addressed.  If he is unable to pass he would lean more toward shockwave lithotripsy.  - Bilateral nephrolithiasis Stable bilateral renal calculi.  Continue annual KUB.   [name], MD  [facility] [address] [place], [place] [address] [phone]

## 2019-04-28 ENCOUNTER — Other Ambulatory Visit: Payer: Self-pay

## 2019-04-28 ENCOUNTER — Encounter: Payer: Self-pay | Admitting: Urology

## 2019-04-28 ENCOUNTER — Telehealth: Payer: Self-pay | Admitting: Urology

## 2019-04-28 DIAGNOSIS — N23 Unspecified renal colic: Secondary | ICD-10-CM | POA: Insufficient documentation

## 2019-04-28 DIAGNOSIS — N401 Enlarged prostate with lower urinary tract symptoms: Secondary | ICD-10-CM

## 2019-04-28 DIAGNOSIS — N201 Calculus of ureter: Secondary | ICD-10-CM | POA: Insufficient documentation

## 2019-04-28 LAB — URINALYSIS, COMPLETE
Bilirubin, UA: NEGATIVE
Leukocytes,UA: NEGATIVE
Nitrite, UA: NEGATIVE
Protein,UA: NEGATIVE
Specific Gravity, UA: 1.01 (ref 1.005–1.030)
Urobilinogen, Ur: 0.2 mg/dL (ref 0.2–1.0)
pH, UA: 5.5 (ref 5.0–7.5)

## 2019-04-28 LAB — MICROSCOPIC EXAMINATION
Bacteria, UA: NONE SEEN
Epithelial Cells (non renal): NONE SEEN /hpf (ref 0–10)
RBC, Urine: NONE SEEN /hpf (ref 0–2)

## 2019-04-28 MED ORDER — TAMSULOSIN HCL 0.4 MG PO CAPS: 0.4000 mg | ORAL_CAPSULE | Freq: Every day | ORAL | 11 refills | Status: DC

## 2019-04-28 MED ORDER — TAMSULOSIN HCL 0.4 MG PO CAPS: 0.4000 mg | ORAL_CAPSULE | Freq: Every day | ORAL | 3 refills | Status: DC

## 2019-04-28 NOTE — Telephone Encounter (Signed)
[facility] states that [facility] need not receive initial rx sent for flomax, rx sent again

## 2019-04-28 NOTE — Telephone Encounter (Signed)
Pt called and states that his Tamsulosin has not been called in yet.  [facility] [address]

## 2019-04-28 NOTE — Telephone Encounter (Signed)
RX sent

## 2019-05-02 ENCOUNTER — Other Ambulatory Visit: Payer: Self-pay | Admitting: Urology

## 2019-05-02 ENCOUNTER — Telehealth: Payer: Self-pay | Admitting: Urology

## 2019-05-02 ENCOUNTER — Ambulatory Visit
Admission: RE | Admit: 2019-05-02 | Discharge: 2019-05-02 | Disposition: A | Discharge status: Home / Self Care | Payer: Medicare Other | Source: Ambulatory Visit | Attending: Urology | Admitting: Urology

## 2019-05-02 ENCOUNTER — Other Ambulatory Visit: Payer: Self-pay

## 2019-05-02 DIAGNOSIS — N201 Calculus of ureter: Secondary | ICD-10-CM

## 2019-05-02 NOTE — Telephone Encounter (Signed)
His stone is seen on KUB today and is just above the pelvic bones.  We could treat with shockwave lithotripsy this Thursday if desired.  Other options as previously discussed: Ureteroscopy and continued trial of passage.  If he desires to proceed with lithotripsy will fill out a scheduling sheet and give to Amy.

## 2019-05-02 NOTE — Telephone Encounter (Signed)
Called pt informed him of the information below. He states he would like to proceed with lithotripsy. Thanks

## 2019-05-02 NOTE — Telephone Encounter (Signed)
Will give scheduling sheet to Amy who will be giving him a call

## 2019-05-03 ENCOUNTER — Other Ambulatory Visit
Admission: RE | Admit: 2019-05-03 | Discharge: 2019-05-03 | Disposition: A | Discharge status: Home / Self Care | Payer: Medicare Other | Source: Ambulatory Visit | Attending: Urology | Admitting: Urology

## 2019-05-03 ENCOUNTER — Other Ambulatory Visit: Payer: Self-pay | Admitting: Radiology

## 2019-05-03 DIAGNOSIS — Z01812 Encounter for preprocedural laboratory examination: Secondary | ICD-10-CM | POA: Diagnosis present

## 2019-05-03 DIAGNOSIS — N201 Calculus of ureter: Secondary | ICD-10-CM

## 2019-05-03 DIAGNOSIS — Z20828 Contact with and (suspected) exposure to other viral communicable diseases: Secondary | ICD-10-CM | POA: Diagnosis not present

## 2019-05-03 LAB — SARS CORONAVIRUS 2 (TAT 6-24 HRS): SARS Coronavirus 2: NEGATIVE

## 2019-05-05 ENCOUNTER — Encounter
Admission: RE | Disposition: A | Discharge status: Home / Self Care | Payer: Self-pay | Source: Home / Self Care | Attending: Urology

## 2019-05-05 ENCOUNTER — Ambulatory Visit
Admission: RE | Admit: 2019-05-05 | Discharge: 2019-05-05 | Disposition: A | Discharge status: Home / Self Care | Payer: Medicare Other | Attending: Urology | Admitting: Urology

## 2019-05-05 ENCOUNTER — Ambulatory Visit: Payer: Medicare Other

## 2019-05-05 ENCOUNTER — Other Ambulatory Visit: Payer: Self-pay

## 2019-05-05 DIAGNOSIS — E7800 Pure hypercholesterolemia, unspecified: Secondary | ICD-10-CM | POA: Insufficient documentation

## 2019-05-05 DIAGNOSIS — Z79899 Other long term (current) drug therapy: Secondary | ICD-10-CM | POA: Insufficient documentation

## 2019-05-05 DIAGNOSIS — N189 Chronic kidney disease, unspecified: Secondary | ICD-10-CM | POA: Insufficient documentation

## 2019-05-05 DIAGNOSIS — H409 Unspecified glaucoma: Secondary | ICD-10-CM | POA: Diagnosis not present

## 2019-05-05 DIAGNOSIS — Z7984 Long term (current) use of oral hypoglycemic drugs: Secondary | ICD-10-CM | POA: Insufficient documentation

## 2019-05-05 DIAGNOSIS — G4730 Sleep apnea, unspecified: Secondary | ICD-10-CM | POA: Diagnosis not present

## 2019-05-05 DIAGNOSIS — N201 Calculus of ureter: Secondary | ICD-10-CM | POA: Diagnosis not present

## 2019-05-05 DIAGNOSIS — E1122 Type 2 diabetes mellitus with diabetic chronic kidney disease: Secondary | ICD-10-CM | POA: Insufficient documentation

## 2019-05-05 DIAGNOSIS — I129 Hypertensive chronic kidney disease with stage 1 through stage 4 chronic kidney disease, or unspecified chronic kidney disease: Secondary | ICD-10-CM | POA: Insufficient documentation

## 2019-05-05 DIAGNOSIS — N202 Calculus of kidney with calculus of ureter: Secondary | ICD-10-CM | POA: Diagnosis present

## 2019-05-05 HISTORY — PX: EXTRACORPOREAL SHOCK WAVE LITHOTRIPSY: SHX1557

## 2019-05-05 SURGERY — LITHOTRIPSY, ESWL
Anesthesia: Moderate Sedation | Laterality: Left

## 2019-05-05 MED ORDER — DIPHENHYDRAMINE HCL 25 MG PO CAPS
ORAL_CAPSULE | ORAL | Status: AC
  Filled 2019-05-05: qty 1

## 2019-05-05 MED ORDER — OXYCODONE HCL 5 MG PO TABS: 5.0000 mg | ORAL_TABLET | Freq: Four times a day (QID) | ORAL | 0 refills | Status: DC | PRN

## 2019-05-05 MED ORDER — DIPHENHYDRAMINE HCL 25 MG PO CAPS
25.0000 mg | ORAL_CAPSULE | ORAL | Status: AC
  Administered 2019-05-05: 25 mg via ORAL

## 2019-05-05 MED ORDER — CIPROFLOXACIN HCL 500 MG PO TABS
500.0000 mg | ORAL_TABLET | ORAL | Status: AC
  Administered 2019-05-05: 500 mg via ORAL

## 2019-05-05 MED ORDER — DIAZEPAM 5 MG PO TABS
10.0000 mg | ORAL_TABLET | ORAL | Status: AC
  Administered 2019-05-05: 10 mg via ORAL

## 2019-05-05 MED ORDER — CIPROFLOXACIN HCL 500 MG PO TABS
ORAL_TABLET | ORAL | Status: AC
  Filled 2019-05-05: qty 1

## 2019-05-05 MED ORDER — ONDANSETRON HCL 4 MG/2ML IJ SOLN
INTRAMUSCULAR | Status: AC
  Administered 2019-05-05: 4 mg via INTRAVENOUS
  Filled 2019-05-05: qty 2

## 2019-05-05 MED ORDER — DIAZEPAM 5 MG PO TABS
ORAL_TABLET | ORAL | Status: AC
  Filled 2019-05-05: qty 2

## 2019-05-05 MED ORDER — SODIUM CHLORIDE 0.9 % IV SOLN
INTRAVENOUS | Status: DC
  Administered 2019-05-05: 10:00:00 via INTRAVENOUS

## 2019-05-05 MED ORDER — ONDANSETRON HCL 4 MG/2ML IJ SOLN
4.0000 mg | Freq: Once | INTRAMUSCULAR | Status: AC | PRN
  Administered 2019-05-05: 10:00:00 4 mg via INTRAVENOUS

## 2019-05-05 NOTE — Discharge Instructions (Addendum)
As per the [facility] discharge instructions  Refill pain medication was sent to [place]   1) The drugs that you were given will stay in your system until tomorrow so for the next 24 hours you should not:  A) Drive an automobile B) Make any legal decisions C) Drink any alcoholic beverage   2) You may resume regular meals tomorrow.  Today it is better to start with liquids and gradually work up to solid foods.  You may eat anything you prefer, but it is better to start with liquids, then soup and crackers, and gradually work up to solid foods.   3) Please notify your doctor immediately if you have any unusual bleeding, trouble breathing, redness and pain at the surgery site, drainage, fever, or pain not relieved by medication.    4) Additional Instructions:        Please contact your physician with any problems or Same Day Surgery at [phone], Monday through Friday 6 am to 4 pm, or [place] at [facility] number at [phone].

## 2019-05-20 ENCOUNTER — Ambulatory Visit: Payer: Medicare Other | Admitting: Urology

## 2019-05-25 ENCOUNTER — Other Ambulatory Visit: Payer: Self-pay

## 2019-05-25 ENCOUNTER — Encounter: Payer: Self-pay | Admitting: Urology

## 2019-05-25 ENCOUNTER — Ambulatory Visit
Admission: RE | Admit: 2019-05-25 | Discharge: 2019-05-25 | Disposition: A | Discharge status: Home / Self Care | Payer: Medicare Other | Source: Ambulatory Visit | Attending: Urology | Admitting: Urology

## 2019-05-25 ENCOUNTER — Ambulatory Visit (INDEPENDENT_AMBULATORY_CARE_PROVIDER_SITE_OTHER): Payer: Medicare Other | Admitting: Urology

## 2019-05-25 ENCOUNTER — Ambulatory Visit
Admission: RE | Admit: 2019-05-25 | Discharge: 2019-05-25 | Disposition: A | Discharge status: Home / Self Care | Payer: Medicare Other | Attending: Urology | Admitting: Urology

## 2019-05-25 VITALS — BP 140/80 | HR 70 | Wt 210.0 lb

## 2019-05-25 DIAGNOSIS — N23 Unspecified renal colic: Secondary | ICD-10-CM | POA: Insufficient documentation

## 2019-05-25 DIAGNOSIS — N201 Calculus of ureter: Secondary | ICD-10-CM

## 2019-05-25 NOTE — Progress Notes (Signed)
05/25/2019 7:52 AM   Cleda Clarks 08/23/1950 [id number]  Referring provider: Lauro Regulus, MD [address] [facility] [place],  [place] [address]  Chief Complaint  Patient presents with  . Follow-up    HPI: 68 y.o. male who underwent shockwave lithotripsy of a 5 mm left proximal ureteral calculus on 05/05/2019.  Since his procedure he has continued to have intermittent left flank pain.  He will be completely asymptomatic for 2-3 days and then have recurrent symptoms.  He is not aware of passing any fragments.  He remains on tamsulosin.  Denies fever or chills.  KUB performed today was reviewed and the previously seen proximal calculus is no longer identified.  No definite fragments seen along the expected course of the ureter.   PMH: Past Medical History:  Diagnosis Date  . Arthritis   . Chronic kidney disease   . Diabetes mellitus without complication (HCC)   . GERD (gastroesophageal reflux disease)   . Glaucoma   . Hypercholesteremia   . Hypertension   . Nephrolithiasis   . Sleep apnea     Surgical History: Past Surgical History:  Procedure Laterality Date  . COLONOSCOPY WITH PROPOFOL N/A 12/24/2015   Procedure: COLONOSCOPY WITH PROPOFOL;  Surgeon: Christena Deem, MD;  Location: [facility] ENDOSCOPY;  Service: Endoscopy;  Laterality: N/A;  . EXTRACORPOREAL SHOCK WAVE LITHOTRIPSY Left 05/05/2019   Procedure: EXTRACORPOREAL SHOCK WAVE LITHOTRIPSY (ESWL);  Surgeon: [name], MD;  Location: [facility] ORS;  Service: Urology;  Laterality: Left;  . fish bone removal     from throat  . TONSILLECTOMY    . WRIST SURGERY Right    Tendonitis    Home Medications:  Allergies as of 05/25/2019      Reactions   Lisinopril Other (See Comments), Cough, Nausea And Vomiting   cough cough      Medication List       Accurate as of May 25, 2019  7:52 AM. If you have any questions, ask your nurse or doctor.        STOP taking these  medications   Azelastine-Fluticasone 137-50 MCG/ACT Susp Stopped by: [name], MD   brimonidine-timolol 0.2-0.5 % ophthalmic solution Commonly known as: COMBIGAN Stopped by: [name], MD   ondansetron 4 MG tablet Commonly known as: ZOFRAN Stopped by: [name], MD     TAKE these medications   acetaminophen 325 MG tablet Commonly known as: Tylenol Take 2 tablets (650 mg total) by mouth every 4 (four) hours as needed for moderate pain.   Assure Comfort Lancets 28G Misc 1 each by Other route 2 (two) times daily Use 1 each twice daily as directed. DX E 11.65   OneTouch Delica Lancets 33G Misc USE TWICE DAILY   Farxiga 10 MG Tabs tablet Generic drug: dapagliflozin propanediol TAKE ONE TABLET BY MOUTH EVERY MORNING   glucose blood test strip Use 1 strip via meter twice daily as directed. Dx. E11.65   losartan 50 MG tablet Commonly known as: COZAAR Take by mouth.   metFORMIN 500 MG 24 hr tablet Commonly known as: GLUCOPHAGE-XR Take by mouth.   ondansetron 4 MG disintegrating tablet Commonly known as: Zofran ODT Take 1 tablet (4 mg total) by mouth every 8 (eight) hours as needed for nausea or vomiting.   oxyCODONE 5 MG immediate release tablet Commonly known as: Roxicodone Take 1-2 tablets (5-10 mg total) by mouth every 6 (six) hours as needed for moderate pain or  severe pain.   rosuvastatin 20 MG tablet Commonly known as: CRESTOR Take by mouth.   tamsulosin 0.4 MG Caps capsule Commonly known as: FLOMAX Take 1 capsule (0.4 mg total) by mouth daily.   Trulicity 1.5 WU/9.8JX Sopn Generic drug: Dulaglutide Inject 1.5 mg into the skin every 7 (seven) days.       Allergies:  Allergies  Allergen Reactions  . Lisinopril Other (See Comments), Cough and Nausea And Vomiting    cough cough    Family History: History reviewed. No pertinent family history.  Social History:  reports that he has never smoked. He has never used smokeless tobacco.  He reports that he does not drink alcohol or use drugs.  ROS: UROLOGY Frequent Urination?: No Hard to postpone urination?: No Burning/pain with urination?: No Get up at night to urinate?: No Leakage of urine?: No Urine stream starts and stops?: No Trouble starting stream?: No Do you have to strain to urinate?: No Blood in urine?: No Urinary tract infection?: No Sexually transmitted disease?: No Injury to kidneys or bladder?: No Painful intercourse?: No Weak stream?: No Erection problems?: No Penile pain?: No  Gastrointestinal Nausea?: No Vomiting?: No Indigestion/heartburn?: No Diarrhea?: No Constipation?: No  Constitutional Fever: No Night sweats?: No Weight loss?: No Fatigue?: No  Skin Skin rash/lesions?: No Itching?: No  Eyes Blurred vision?: No Double vision?: No  Ears/Nose/Throat Sore throat?: No Sinus problems?: No  Hematologic/Lymphatic Swollen glands?: No Easy bruising?: No  Cardiovascular Leg swelling?: No Chest pain?: No  Respiratory Cough?: No Shortness of breath?: No  Endocrine Excessive thirst?: No  Musculoskeletal Back pain?: No Joint pain?: No  Neurological Headaches?: No Dizziness?: No  Psychologic Depression?: No Anxiety?: No  Physical Exam: BP 140/80   Pulse 70   Wt 210 lb (95.3 kg)   HC 66" (167.6 cm)   BMI 33.89 kg/m   Constitutional:  Alert and oriented, No acute distress. HEENT: [place] AT, moist mucus membranes.  Trachea midline, no masses. Cardiovascular: No clubbing, cyanosis, or edema. Respiratory: Normal respiratory effort, no increased work of breathing.   Assessment & Plan:   68 y.o. male status post shockwave lithotripsy with persistent symptoms.  No definite calculus or fragments were identified on today's KUB.  He was sent for CT which does show a 3 mm left UVJ fragment.  I recommended a continued trial of passage as there is an excellent chance that the small fragment will pass.  Oxycodone was  refilled.  Telephone follow-up 4 weeks.  Abbie Sons, [place] [address], [place],  [address] [phone]

## 2019-05-29 ENCOUNTER — Encounter: Payer: Self-pay | Admitting: Urology

## 2019-05-30 ENCOUNTER — Other Ambulatory Visit: Payer: Self-pay | Admitting: Urology

## 2019-05-30 ENCOUNTER — Telehealth: Payer: Self-pay | Admitting: Urology

## 2019-05-30 MED ORDER — OXYCODONE HCL 5 MG PO TABS: 5.0000 mg | ORAL_TABLET | Freq: Four times a day (QID) | ORAL | 0 refills | Status: DC | PRN

## 2019-05-30 NOTE — Telephone Encounter (Signed)
-----   Message from Abbie Sons, MD sent at 05/29/2019  8:07 PM EST ----- Please schedule telephone follow-up 4 weeks

## 2019-05-30 NOTE — Telephone Encounter (Signed)
Patient is aware 

## 2019-06-17 ENCOUNTER — Other Ambulatory Visit: Payer: Self-pay | Admitting: Urology

## 2019-06-17 NOTE — Telephone Encounter (Signed)
Does pt need to continue potassium? Please advise.

## 2019-06-20 NOTE — Telephone Encounter (Signed)
He has an appointment next week.  I will hold off on renewing the prescription and talk to him about LithoLyte.

## 2019-06-21 ENCOUNTER — Other Ambulatory Visit: Payer: Self-pay | Admitting: Urology

## 2019-06-27 ENCOUNTER — Telehealth (INDEPENDENT_AMBULATORY_CARE_PROVIDER_SITE_OTHER): Payer: Medicare Other | Admitting: Urology

## 2019-06-27 ENCOUNTER — Other Ambulatory Visit: Payer: Self-pay

## 2019-06-27 DIAGNOSIS — N200 Calculus of kidney: Secondary | ICD-10-CM | POA: Diagnosis not present

## 2019-06-27 NOTE — Progress Notes (Signed)
Virtual Visit via Telephone Note  I connected with Pat Kocher on 06/27/19 at 11:30 AM EST by telephone and verified that I am speaking with the correct person using two identifiers.  Location: Patient: Home Provider: Office   I discussed the limitations, risks, security and privacy concerns of performing an evaluation and management service by telephone and the availability of in person appointments. I also discussed with the patient that there may be a patient responsible charge related to this service. The patient expressed understanding and agreed to proceed.   History of Present Illness: Mr. Chiquito underwent shockwave lithotripsy of a 5 mm left proximal ureteral calculus on 05/05/2019.  At his follow-up visit on 11/11 he was complaining of persistent intermittent left flank pain.  He had not passed any fragments.  A noncontrast CT of the abdomen pelvis was repeated which showed only a 3 mm left UVJ fragment and no other ureteral fragments.  He states his pain resolved shortly after the CT and he is presently asymptomatic.   Observations/Objective: N/A  Assessment and Plan: 68 y.o. male status post shockwave lithotripsy of a left ureteral calculus.  He is presently asymptomatic.  Based on the small fragment size and resolution of symptoms do not feel repeat imaging is needed.  Follow Up Instructions: -Follow-up 6 months with a KUB -I discussed substituting LithoLyte and discontinuing potassium citrate  and he was interested in pursuing.  He will be mailed an order form.  I discussed the assessment and treatment plan with the patient. The patient was provided an opportunity to ask questions and all were answered. The patient agreed with the plan and demonstrated an understanding of the instructions.   The patient was advised to call back or seek an in-person evaluation if the symptoms worsen or if the condition fails to improve as anticipated.  I provided 5 minutes of  non-face-to-face time during this encounter.   Abbie Sons, MD

## 2019-07-13 ENCOUNTER — Telehealth: Payer: Self-pay

## 2019-07-13 NOTE — Telephone Encounter (Signed)
-----   Message from Abbie Sons, MD sent at 07/12/2019  5:17 PM EST ----- Regarding: LithoLyte Would you please mail Mr. Cofer one of the LithoLyte tear off order forms: 10 M EQ dissolved in water twice daily.  Thanks

## 2019-07-13 NOTE — Telephone Encounter (Signed)
Order form mailed to patient.

## 2019-12-23 ENCOUNTER — Other Ambulatory Visit: Payer: Self-pay

## 2019-12-23 DIAGNOSIS — N200 Calculus of kidney: Secondary | ICD-10-CM

## 2019-12-26 ENCOUNTER — Ambulatory Visit (INDEPENDENT_AMBULATORY_CARE_PROVIDER_SITE_OTHER): Payer: Medicare Other | Admitting: Urology

## 2019-12-26 ENCOUNTER — Ambulatory Visit
Admission: RE | Admit: 2019-12-26 | Discharge: 2019-12-26 | Disposition: A | Discharge status: Home / Self Care | Payer: Medicare Other | Source: Ambulatory Visit | Attending: Urology | Admitting: Urology

## 2019-12-26 ENCOUNTER — Other Ambulatory Visit: Payer: Self-pay

## 2019-12-26 ENCOUNTER — Encounter: Payer: Self-pay | Admitting: Urology

## 2019-12-26 VITALS — BP 137/80 | HR 84 | Ht 66.0 in | Wt 200.0 lb

## 2019-12-26 DIAGNOSIS — N401 Enlarged prostate with lower urinary tract symptoms: Secondary | ICD-10-CM | POA: Diagnosis not present

## 2019-12-26 DIAGNOSIS — N200 Calculus of kidney: Secondary | ICD-10-CM

## 2019-12-26 NOTE — Progress Notes (Signed)
PATIENT ID: Devon Valencia, male     DOB: 21-Apr-1951, 69 y.o.     MRN: [id number]   ENCOUNTER: 12/26/19, 3:03 PM     REFERRING PROVIDER: Lauro Regulus, MD [address] [facility] [place],  [place] [address]  Chief Complaint  Patient presents with  . Nephrolithiasis    Urologic history:  1.Bilateral nephrolithiasis -ESWL 04/2019 5 mm left proximal ureteral calculus -Last CT 05/2019 bilateral renal calculi; largest 11 mm left kidney  2.History elevated PSA -Previous biopsy around 2010 which was benign. PSA at time of biopsy unknown; subsequent PSAs normal and stable  3.BPH with mild lower urinary tract symptoms   HPI: Devon Valencia presents today for a 6 month follow up of his nephrolithiasis.    Denies urinary issues, including hematuria, frequency, or dysuria.  Notes post void dribbling  Last PSA 2019 1.77  PMHx: Past Medical History:  Diagnosis Date  . Arthritis   . Chronic kidney disease   . Diabetes mellitus without complication (HCC)   . GERD (gastroesophageal reflux disease)   . Glaucoma   . Hypercholesteremia   . Hypertension   . Nephrolithiasis   . Sleep apnea     SURGICAL HISTORY: Past Surgical History:  Procedure Laterality Date  . COLONOSCOPY WITH PROPOFOL N/A 12/24/2015   Procedure: COLONOSCOPY WITH PROPOFOL;  Surgeon: Christena Deem, MD;  Location: [facility] ENDOSCOPY;  Service: Endoscopy;  Laterality: N/A;  . EXTRACORPOREAL SHOCK WAVE LITHOTRIPSY Left 05/05/2019   Procedure: EXTRACORPOREAL SHOCK WAVE LITHOTRIPSY (ESWL);  Surgeon: [name], MD;  Location: [facility] ORS;  Service: Urology;  Laterality: Left;  . fish bone removal     from throat  . TONSILLECTOMY    . WRIST SURGERY Right    Tendonitis    HOME MEDICATIONS:  Allergies as of 12/26/2019      Reactions   Lisinopril Other (See Comments), Cough, Nausea And Vomiting   cough cough      Medication List       Accurate as of December 26, 2019   3:03 PM. If you have any questions, ask your nurse or doctor.        acetaminophen 325 MG tablet Commonly known as: Tylenol Take 2 tablets (650 mg total) by mouth every 4 (four) hours as needed for moderate pain.   Assure Comfort Lancets 28G Misc 1 each by Other route 2 (two) times daily Use 1 each twice daily as directed. DX E 11.65   OneTouch Delica Lancets 33G Misc USE TWICE DAILY   Farxiga 10 MG Tabs tablet Generic drug: dapagliflozin propanediol TAKE ONE TABLET BY MOUTH EVERY MORNING   glucose blood test strip Use 1 strip via meter twice daily as directed. Dx. E11.65   losartan 50 MG tablet Commonly known as: COZAAR Take by mouth.   metFORMIN 500 MG 24 hr tablet Commonly known as: GLUCOPHAGE-XR Take by mouth.   ondansetron 4 MG disintegrating tablet Commonly known as: Zofran ODT Take 1 tablet (4 mg total) by mouth every 8 (eight) hours as needed for nausea or vomiting.   oxyCODONE 5 MG immediate release tablet Commonly known as: Roxicodone Take 1-2 tablets (5-10 mg total) by mouth every 6 (six) hours as needed for moderate pain or severe pain.   rosuvastatin 20 MG tablet Commonly known as: CRESTOR Take by mouth.   tamsulosin 0.4 MG Caps capsule Commonly known as: FLOMAX Take 1 capsule (0.4 mg total) by mouth daily.   Trulicity 1.5 MG/0.5ML Sopn  Generic drug: Dulaglutide Inject 1.5 mg into the skin every 7 (seven) days.       ALLERGIES: Allergies  Allergen Reactions  . Lisinopril Other (See Comments), Cough and Nausea And Vomiting    cough cough    FAMILY HISTORY: No family history on file.  SOCIAL HISTORY:  reports that he has never smoked. He has never used smokeless tobacco. He reports that he does not drink alcohol and does not use drugs.  PHYSICAL EXAM: BP 137/80   Pulse 84   Ht 5\' 6"  (1.676 m)   Wt 200 lb (90.7 kg)   BMI 32.28 kg/m   Constitutional:  Alert and oriented, No acute distress. HEENT: [place] AT, moist mucus membranes.  Trachea  midline, no masses. Cardiovascular: No clubbing, cyanosis, or edema. Respiratory: Normal respiratory effort, no increased work of breathing. GI: Abdomen is soft, nontender, nondistended, no abdominal masses GU: Prostate 40 g, smooth without nodules Skin: No rashes, bruises or suspicious lesions. Neurologic: Grossly intact, no focal deficits, moving all 4 extremities. Psychiatric: Normal mood and affect.        ASSESSMENT/PLAN:   1. Nephrolithiasis   s/p shockwave lithotripsy of a 5 mm left proximal ureteral calculus on 05/05/2019  KUB today and he will be able notified with the results  Continue annual follow-up  2. History elevated PSA       -Benign DRE       -PSA ordered, if stable we discussed current recommendations for prostate cancer screening and will DC annual screening   Abbie Sons, MD  [place] [address], [place], [place] [address] [phone]  By signing my name below, I, [employer], attest that this documentation has been prepared under the direction and in the presence of John Giovanni, MD. Electronically Signed: Abbie Sons, MD 12/26/19, 3:03 PM   I have reviewed the above documentation for accuracy and completeness, and I agree with the above.    Abbie Sons, MD

## 2019-12-27 ENCOUNTER — Telehealth: Payer: Self-pay | Admitting: *Deleted

## 2019-12-27 ENCOUNTER — Encounter: Payer: Self-pay | Admitting: Urology

## 2019-12-27 LAB — PSA: Prostate Specific Ag, Serum: 2.1 ng/mL (ref 0.0–4.0)

## 2019-12-27 NOTE — Telephone Encounter (Signed)
Notified patient as instructed, patient pleased. Discussed follow-up appointments, patient agrees  

## 2019-12-27 NOTE — Telephone Encounter (Signed)
-----   Message from [name], MD sent at 12/27/2019  2:01 PM EDT ----- KUB reviewed and shows stable, bilateral renal stones

## 2019-12-27 NOTE — Telephone Encounter (Signed)
-----   Message from Scott C Stoioff, MD sent at 12/27/2019  2:01 PM EDT ----- KUB reviewed and shows stable, bilateral renal stones 

## 2020-03-08 IMAGING — CT CT RENAL STONE PROTOCOL
2 of 4 series · 16 of 46 positions shown, 18 images · non-contrast
Comparison: 04/24/2019

CLINICAL DATA: Left flank pain for 1 month. Recent lithotripsy for
urinary calculi.

EXAM:
CT ABDOMEN AND PELVIS WITHOUT CONTRAST
TECHNIQUE: Multidetector CT imaging of the abdomen and pelvis was performed
following the standard protocol without IV contrast.

[Series 2: stone full standard · axial · 0.73mm/px · z∈[-869,-434]mm · 13 of 97 slices shown, 15 images]
[im 5/97  soft-tissue]
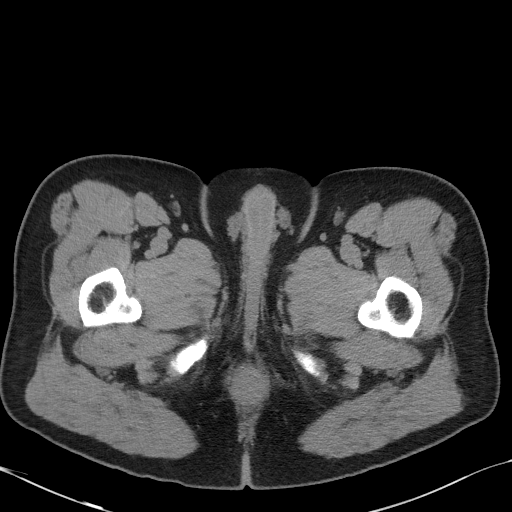
[im 5/97  bone]
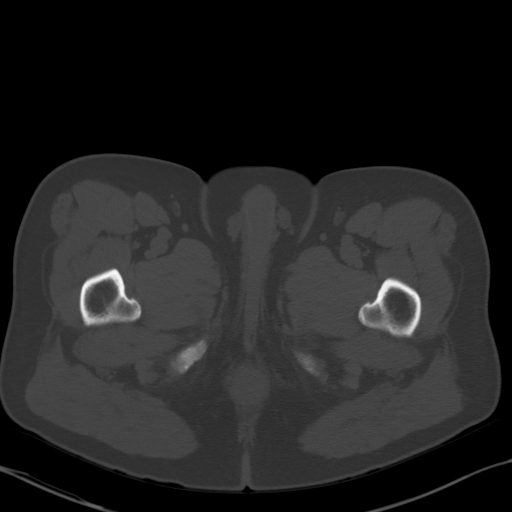
[im 13/97  soft-tissue]
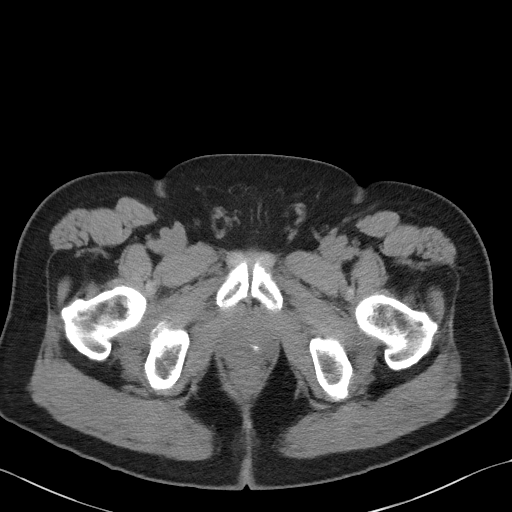
[im 21/97  soft-tissue]
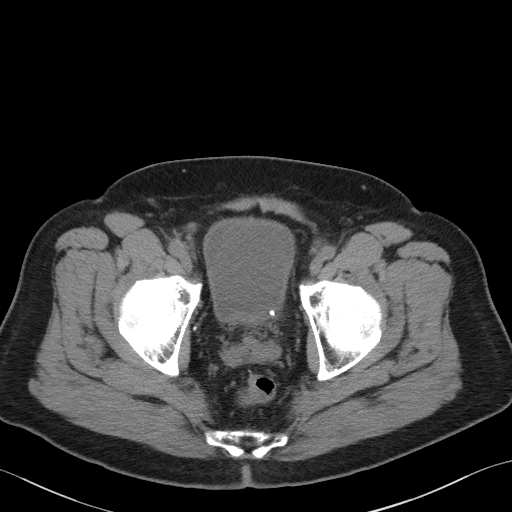
[im 26/97  soft-tissue]
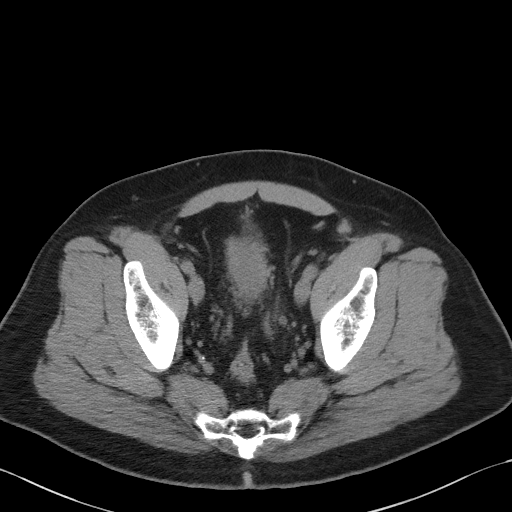
[im 34/97  soft-tissue]
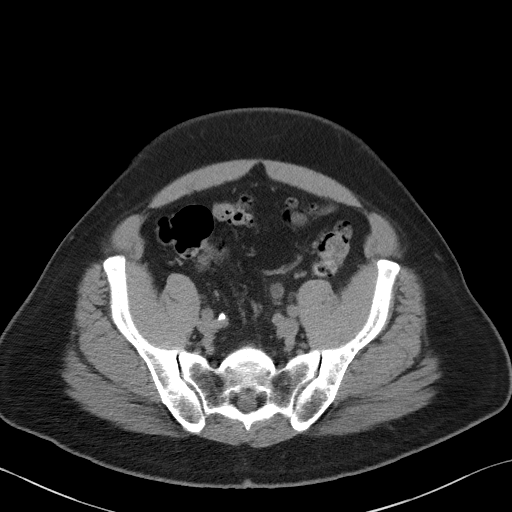
[im 42/97  soft-tissue]
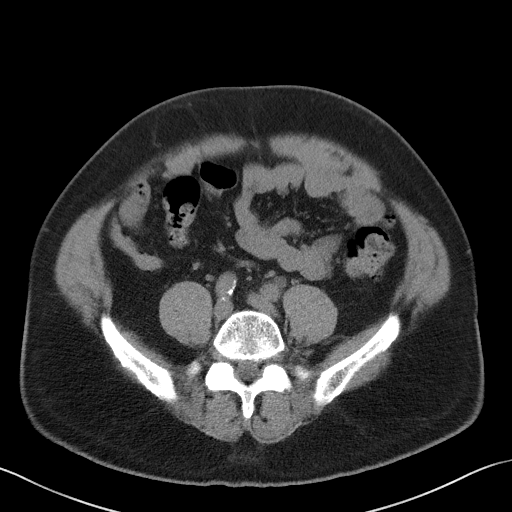
[im 51/97  soft-tissue]
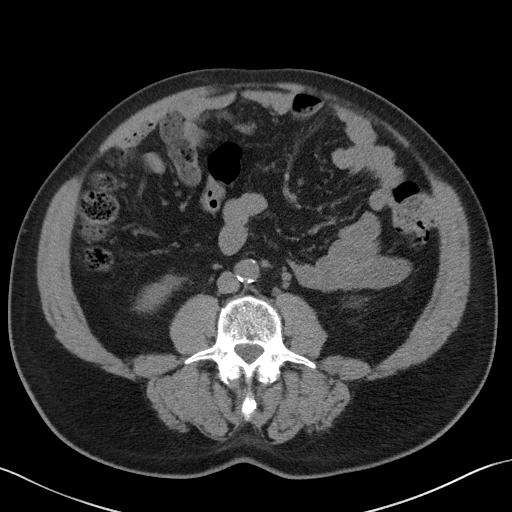
[im 55/97  soft-tissue]
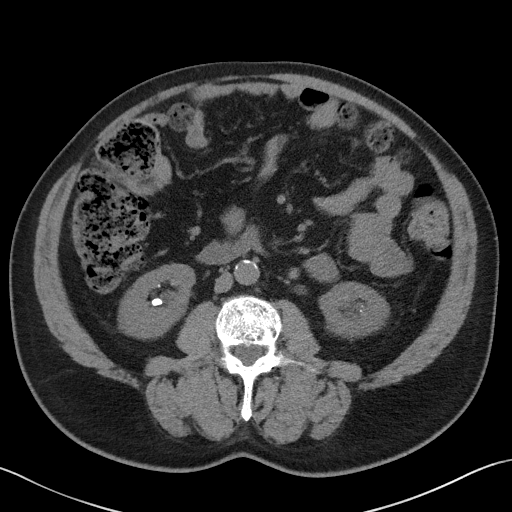
[im 63/97  soft-tissue]
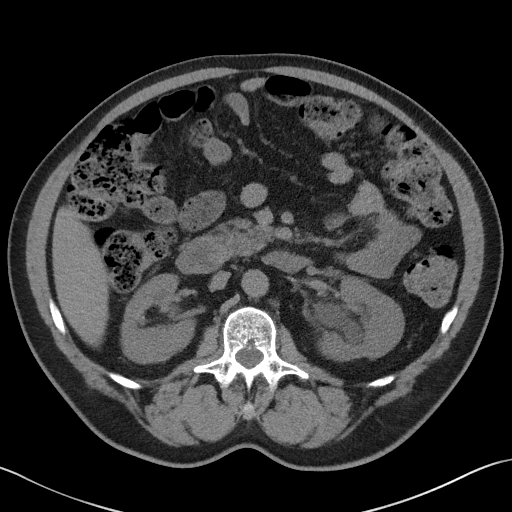
[im 63/97  bone]
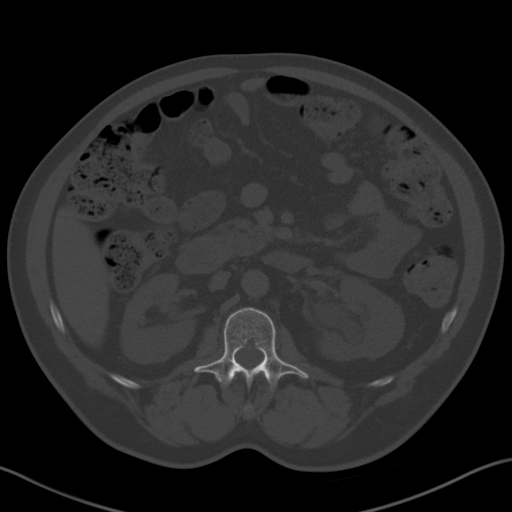
[im 71/97  soft-tissue]
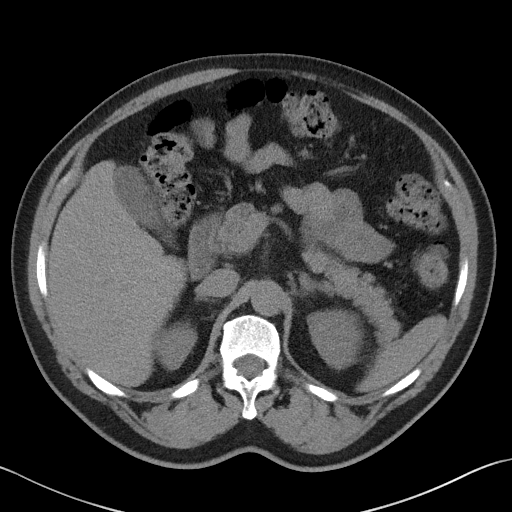
[im 76/97  soft-tissue]
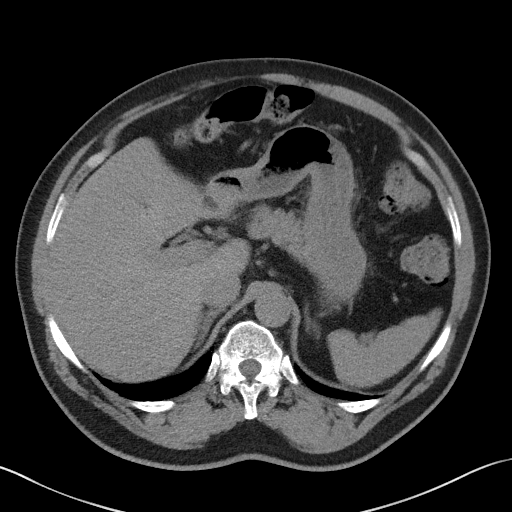
[im 84/97  soft-tissue]
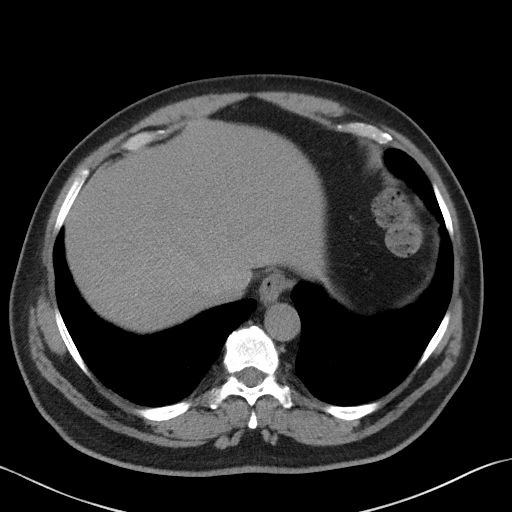
[im 92/97  soft-tissue]
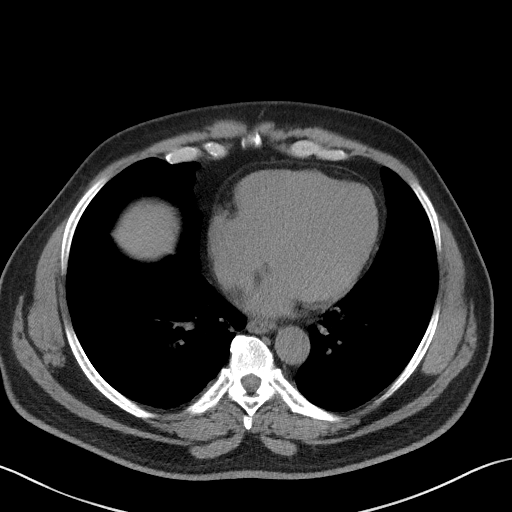

[Series 5: coronal · coronal · 0.78mm/px · 3 of 161 slices shown]
[im 54/161  soft-tissue]
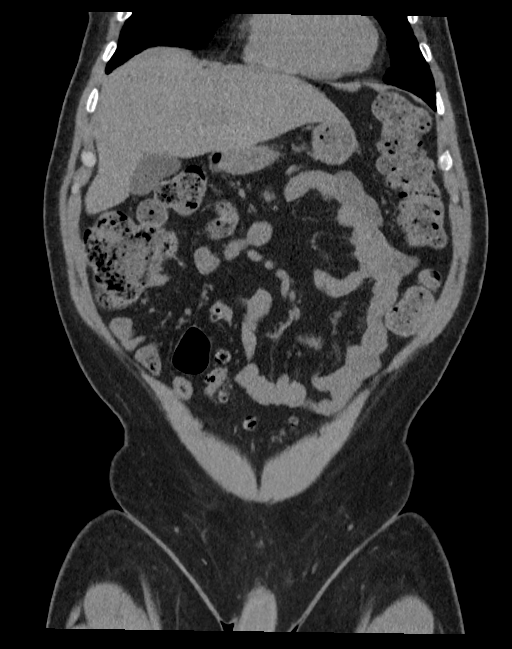
[im 72/161  soft-tissue]
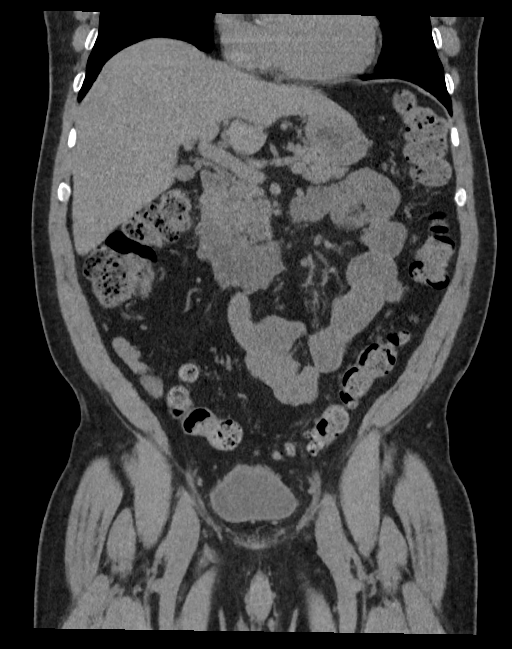
[im 89/161  soft-tissue]
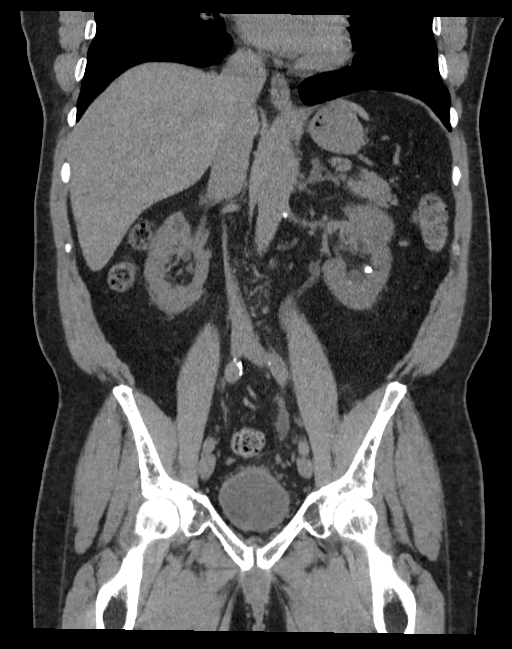

[16 of 46 positions shown; findings below may reference images not displayed]

FINDINGS: Lower chest: No acute findings.

Hepatobiliary: No mass visualized on this unenhanced exam.
Gallbladder is unremarkable. No evidence of biliary ductal
dilatation.

Pancreas: No mass or inflammatory process visualized on this
unenhanced exam.

Spleen:  Within normal limits in size.

Adrenals/Urinary tract: Multiple bilateral renal calculi are again
seen seen, largest in the left kidney measuring 11 mm. Mild left
hydroureteronephrosis is seen which is now due to a distal ureteral
calculus at the left UVJ measuring 3 mm.

Stomach/Bowel: No evidence of obstruction, inflammatory process, or
abnormal fluid collections. Diverticulosis is seen mainly involving
the descending and sigmoid colon, however there is no evidence of
diverticulitis.

Vascular/Lymphatic: No pathologically enlarged lymph nodes
identified. No evidence of abdominal aortic aneurysm. Aortic
atherosclerosis.

Reproductive:  Mildly enlarged prostate.

Other:  None.

Musculoskeletal:  No suspicious bone lesions identified.
IMPRESSION: Mild left hydroureteronephrosis due to 3 mm distal ureteral calculus
at the left UVJ.

Bilateral nephrolithiasis.

Colonic diverticulosis, without radiographic evidence of
diverticulitis.

Mildly enlarged prostate.

Aortic Atherosclerosis (U6KCY-HOR.R).

## 2020-04-26 ENCOUNTER — Ambulatory Visit: Payer: Medicare Other | Admitting: Urology

## 2020-12-25 ENCOUNTER — Other Ambulatory Visit: Payer: Self-pay | Admitting: Family Medicine

## 2020-12-25 DIAGNOSIS — N200 Calculus of kidney: Secondary | ICD-10-CM

## 2020-12-26 ENCOUNTER — Ambulatory Visit
Admission: RE | Admit: 2020-12-26 | Discharge: 2020-12-26 | Disposition: A | Discharge status: Home / Self Care | Payer: Medicare Other | Source: Ambulatory Visit | Attending: Urology | Admitting: Urology

## 2020-12-26 ENCOUNTER — Encounter: Payer: Self-pay | Admitting: Urology

## 2020-12-26 ENCOUNTER — Other Ambulatory Visit: Payer: Self-pay

## 2020-12-26 ENCOUNTER — Ambulatory Visit (INDEPENDENT_AMBULATORY_CARE_PROVIDER_SITE_OTHER): Payer: Medicare Other | Admitting: Urology

## 2020-12-26 ENCOUNTER — Ambulatory Visit
Admission: RE | Admit: 2020-12-26 | Discharge: 2020-12-26 | Disposition: A | Discharge status: Home / Self Care | Payer: Medicare Other | Attending: Urology | Admitting: Urology

## 2020-12-26 VITALS — BP 138/72 | HR 79 | Ht 66.0 in | Wt 221.0 lb

## 2020-12-26 DIAGNOSIS — N200 Calculus of kidney: Secondary | ICD-10-CM | POA: Diagnosis not present

## 2020-12-26 DIAGNOSIS — N201 Calculus of ureter: Secondary | ICD-10-CM

## 2020-12-26 DIAGNOSIS — N401 Enlarged prostate with lower urinary tract symptoms: Secondary | ICD-10-CM

## 2020-12-26 DIAGNOSIS — Z87898 Personal history of other specified conditions: Secondary | ICD-10-CM | POA: Diagnosis not present

## 2020-12-26 NOTE — Progress Notes (Signed)
12/26/2020 4:47 PM   Devon Valencia 1950-11-02 [id number]  Referring provider: Lauro Regulus, MD [address] [facility] [place],  [place] [address]  Chief Complaint  Patient presents with   Nephrolithiasis    Urologic history:    1. Bilateral nephrolithiasis  -ESWL 04/2019 5 mm left proximal ureteral calculus -Last CT 05/2019 bilateral renal calculi; largest 11 mm left kidney   2.  History elevated PSA -Previous biopsy around 2010 which was benign.  PSA at time of biopsy unknown; subsequent PSAs normal and stable   3.  BPH with mild lower urinary tract symptoms -Tamsulosin  HPI: 70 y.o. male presents for annual follow-up.  Since last years visit he has been doing well and denies flank, abdominal or pelvic pain No dysuria or gross hematuria Mild lower urinary tract symptoms which are stable on tamsulosin Remains on LithoLyte and tolerating well States urine was darker appearing earlier this week but resolved with hydration-no definite hematuria   PMH: Past Medical History:  Diagnosis Date   Arthritis    Chronic kidney disease    Diabetes mellitus without complication (HCC)    GERD (gastroesophageal reflux disease)    Glaucoma    Hypercholesteremia    Hypertension    Nephrolithiasis    Sleep apnea     Surgical History: Past Surgical History:  Procedure Laterality Date   COLONOSCOPY WITH PROPOFOL N/A 12/24/2015   Procedure: COLONOSCOPY WITH PROPOFOL;  Surgeon: Christena Deem, MD;  Location: [facility] ENDOSCOPY;  Service: Endoscopy;  Laterality: N/A;   EXTRACORPOREAL SHOCK WAVE LITHOTRIPSY Left 05/05/2019   Procedure: EXTRACORPOREAL SHOCK WAVE LITHOTRIPSY (ESWL);  Surgeon: [name], MD;  Location: [facility] ORS;  Service: Urology;  Laterality: Left;   fish bone removal     from throat   TONSILLECTOMY     WRIST SURGERY Right    Tendonitis    Home Medications:  Allergies as of 12/26/2020       Reactions   Lisinopril Other (See  Comments), Cough, Nausea And Vomiting   cough cough        Medication List        Accurate as of December 26, 2020  4:47 PM. If you have any questions, ask your nurse or doctor.          Assure Comfort Lancets 28G Misc 1 each by Other route 2 (two) times daily Use 1 each twice daily as directed. DX E 11.65   OneTouch Delica Lancets 33G Misc USE TWICE DAILY   Farxiga 10 MG Tabs tablet Generic drug: dapagliflozin propanediol TAKE ONE TABLET BY MOUTH EVERY MORNING   glucose blood test strip Use 1 strip via meter twice daily as directed. Dx. E11.65   losartan 50 MG tablet Commonly known as: COZAAR Take by mouth.   metFORMIN 500 MG 24 hr tablet Commonly known as: GLUCOPHAGE-XR Take by mouth.   ondansetron 4 MG disintegrating tablet Commonly known as: Zofran ODT Take 1 tablet (4 mg total) by mouth every 8 (eight) hours as needed for nausea or vomiting.   oxyCODONE 5 MG immediate release tablet Commonly known as: Roxicodone Take 1-2 tablets (5-10 mg total) by mouth every 6 (six) hours as needed for moderate pain or severe pain.   rosuvastatin 20 MG tablet Commonly known as: CRESTOR Take by mouth.   tamsulosin 0.4 MG Caps capsule Commonly known as: FLOMAX Take 1 capsule (0.4 mg total) by mouth daily.   Trulicity 1.5 MG/0.5ML Sopn Generic drug: Dulaglutide  Inject 1.5 mg into the skin every 7 (seven) days.        Allergies:  Allergies  Allergen Reactions   Lisinopril Other (See Comments), Cough and Nausea And Vomiting    cough cough    Family History: No family history on file.  Social History:  reports that he has never smoked. He has never used smokeless tobacco. He reports that he does not drink alcohol and does not use drugs.   Physical Exam: BP 138/72   Pulse 79   Ht 5\' 6"  (1.676 m)   Wt 221 lb (100.2 kg)   BMI 35.67 kg/m   Constitutional:  Alert and oriented, No acute distress. HEENT: [place] AT, moist mucus membranes.  Trachea midline, no  masses. Cardiovascular: No clubbing, cyanosis, or edema. Respiratory: Normal respiratory effort, no increased work of breathing. GU: Prostate 40 g, smooth without nodules Skin: No rashes, bruises or suspicious lesions. Neurologic: Grossly intact, no focal deficits, moving all 4 extremities. Psychiatric: Normal mood and affect.   Pertinent Imaging: KUB was obtained after he left the office and on review there are stable bilateral renal calculi however there appears to be migration of a previously noted right upper pole calculus into the right proximal ureter.  This calculus measures 5 x 7 mm  Assessment & Plan:    1. Nephrolithiasis Migration of a previously noted right upper pole calculus into the right proximal ureter Will verify with a renal ultrasound Based on stone size unlikely that I will pass He will be notified with KUB results and recommendations of renal ultrasound  2.  History elevated PSA He desires to continue annual PSA checks Benign DRE  3.  BPH with LUTS Stable on tamsulosin    , MD  [facility] [address] [place], [place] [phone]

## 2020-12-26 NOTE — H&P (View-Only) (Signed)
 12/26/2020 4:47 PM   Devon Valencia 10/25/1950 [id number]  Referring provider: Anderson, Marshall W, MD [address] [facility] [place],  [place] [address]  Chief Complaint  Patient presents with   Nephrolithiasis    Urologic history:    1. Bilateral nephrolithiasis  -ESWL 04/2019 5 mm left proximal ureteral calculus -Last CT 05/2019 bilateral renal calculi; largest 11 mm left kidney   2.  History elevated PSA -Previous biopsy around 2010 which was benign.  PSA at time of biopsy unknown; subsequent PSAs normal and stable   3.  BPH with mild lower urinary tract symptoms -Tamsulosin  HPI: 70 y.o. male presents for annual follow-up.  Since last years visit he has been doing well and denies flank, abdominal or pelvic pain No dysuria or gross hematuria Mild lower urinary tract symptoms which are stable on tamsulosin Remains on LithoLyte and tolerating well States urine was darker appearing earlier this week but resolved with hydration-no definite hematuria   PMH: Past Medical History:  Diagnosis Date   Arthritis    Chronic kidney disease    Diabetes mellitus without complication (HCC)    GERD (gastroesophageal reflux disease)    Glaucoma    Hypercholesteremia    Hypertension    Nephrolithiasis    Sleep apnea     Surgical History: Past Surgical History:  Procedure Laterality Date   COLONOSCOPY WITH PROPOFOL N/A 12/24/2015   Procedure: COLONOSCOPY WITH PROPOFOL;  Surgeon: Martin U Skulskie, MD;  Location: [facility] ENDOSCOPY;  Service: Endoscopy;  Laterality: N/A;   EXTRACORPOREAL SHOCK WAVE LITHOTRIPSY Left 05/05/2019   Procedure: EXTRACORPOREAL SHOCK WAVE LITHOTRIPSY (ESWL);  Surgeon: Ferris Tally C, MD;  Location: [facility] ORS;  Service: Urology;  Laterality: Left;   fish bone removal     from throat   TONSILLECTOMY     WRIST SURGERY Right    Tendonitis    Home Medications:  Allergies as of 12/26/2020       Reactions   Lisinopril Other (See  Comments), Cough, Nausea And Vomiting   cough cough        Medication List        Accurate as of December 26, 2020  4:47 PM. If you have any questions, ask your nurse or doctor.          Assure Comfort Lancets 28G Misc 1 each by Other route 2 (two) times daily Use 1 each twice daily as directed. DX E 11.65   OneTouch Delica Lancets 33G Misc USE TWICE DAILY   Farxiga 10 MG Tabs tablet Generic drug: dapagliflozin propanediol TAKE ONE TABLET BY MOUTH EVERY MORNING   glucose blood test strip Use 1 strip via meter twice daily as directed. Dx. E11.65   losartan 50 MG tablet Commonly known as: COZAAR Take by mouth.   metFORMIN 500 MG 24 hr tablet Commonly known as: GLUCOPHAGE-XR Take by mouth.   ondansetron 4 MG disintegrating tablet Commonly known as: Zofran ODT Take 1 tablet (4 mg total) by mouth every 8 (eight) hours as needed for nausea or vomiting.   oxyCODONE 5 MG immediate release tablet Commonly known as: Roxicodone Take 1-2 tablets (5-10 mg total) by mouth every 6 (six) hours as needed for moderate pain or severe pain.   rosuvastatin 20 MG tablet Commonly known as: CRESTOR Take by mouth.   tamsulosin 0.4 MG Caps capsule Commonly known as: FLOMAX Take 1 capsule (0.4 mg total) by mouth daily.   Trulicity 1.5 MG/0.5ML Sopn Generic drug: Dulaglutide   Inject 1.5 mg into the skin every 7 (seven) days.        Allergies:  Allergies  Allergen Reactions   Lisinopril Other (See Comments), Cough and Nausea And Vomiting    cough cough    Family History: No family history on file.  Social History:  reports that he has never smoked. He has never used smokeless tobacco. He reports that he does not drink alcohol and does not use drugs.   Physical Exam: BP 138/72   Pulse 79   Ht 5\' 6"  (1.676 m)   Wt 221 lb (100.2 kg)   BMI 35.67 kg/m   Constitutional:  Alert and oriented, No acute distress. HEENT: [place] AT, moist mucus membranes.  Trachea midline, no  masses. Cardiovascular: No clubbing, cyanosis, or edema. Respiratory: Normal respiratory effort, no increased work of breathing. GU: Prostate 40 g, smooth without nodules Skin: No rashes, bruises or suspicious lesions. Neurologic: Grossly intact, no focal deficits, moving all 4 extremities. Psychiatric: Normal mood and affect.   Pertinent Imaging: KUB was obtained after he left the office and on review there are stable bilateral renal calculi however there appears to be migration of a previously noted right upper pole calculus into the right proximal ureter.  This calculus measures 5 x 7 mm  Assessment & Plan:    1. Nephrolithiasis Migration of a previously noted right upper pole calculus into the right proximal ureter Will verify with a renal ultrasound Based on stone size unlikely that I will pass He will be notified with KUB results and recommendations of renal ultrasound  2.  History elevated PSA He desires to continue annual PSA checks Benign DRE  3.  BPH with LUTS Stable on tamsulosin    , MD  [facility] [address] [place], [place] [phone]

## 2020-12-27 ENCOUNTER — Telehealth: Payer: Self-pay | Admitting: Urology

## 2020-12-27 DIAGNOSIS — N201 Calculus of ureter: Secondary | ICD-10-CM

## 2020-12-27 DIAGNOSIS — N200 Calculus of kidney: Secondary | ICD-10-CM

## 2020-12-27 LAB — PSA: Prostate Specific Ag, Serum: 2.5 ng/mL (ref 0.0–4.0)

## 2020-12-27 NOTE — Telephone Encounter (Signed)
Contacted patient to discuss stone migration on yesterday's KUB.  Voicemail left to call back.

## 2020-12-28 LAB — URINALYSIS, COMPLETE
Bilirubin, UA: NEGATIVE
Ketones, UA: NEGATIVE
Leukocytes,UA: NEGATIVE
Nitrite, UA: NEGATIVE
Protein,UA: NEGATIVE
RBC, UA: NEGATIVE
Specific Gravity, UA: 1.01 (ref 1.005–1.030)
Urobilinogen, Ur: 0.2 mg/dL (ref 0.2–1.0)
pH, UA: 6 (ref 5.0–7.5)

## 2020-12-28 LAB — MICROSCOPIC EXAMINATION
Bacteria, UA: NONE SEEN
RBC, Urine: NONE SEEN /hpf (ref 0–2)

## 2020-12-30 NOTE — Telephone Encounter (Addendum)
Contacted Mr. Thau 12/28/2020.  Discussed migration of a right renal calculus to the right proximal ureter.  Renal ultrasound has been ordered.  Treatment has been recommended and I discussed ureteroscopy and shockwave lithotripsy he has had previous success with shockwave lithotripsy and desires this option.

## 2021-01-02 ENCOUNTER — Encounter: Payer: Self-pay | Admitting: Urology

## 2021-01-09 ENCOUNTER — Ambulatory Visit
Admission: RE | Admit: 2021-01-09 | Discharge: 2021-01-09 | Disposition: A | Discharge status: Home / Self Care | Payer: Medicare Other | Source: Ambulatory Visit | Attending: Urology | Admitting: Urology

## 2021-01-09 ENCOUNTER — Other Ambulatory Visit: Payer: Self-pay

## 2021-01-09 ENCOUNTER — Other Ambulatory Visit: Payer: Self-pay | Admitting: Urology

## 2021-01-09 DIAGNOSIS — N201 Calculus of ureter: Secondary | ICD-10-CM | POA: Insufficient documentation

## 2021-01-09 DIAGNOSIS — N200 Calculus of kidney: Secondary | ICD-10-CM | POA: Insufficient documentation

## 2021-01-10 ENCOUNTER — Ambulatory Visit
Admission: RE | Admit: 2021-01-10 | Discharge: 2021-01-10 | Disposition: A | Discharge status: Home / Self Care | Payer: Medicare Other | Attending: Urology | Admitting: Urology

## 2021-01-10 ENCOUNTER — Ambulatory Visit: Payer: Medicare Other

## 2021-01-10 ENCOUNTER — Encounter
Admission: RE | Disposition: A | Discharge status: Home / Self Care | Payer: Self-pay | Source: Home / Self Care | Attending: Urology

## 2021-01-10 ENCOUNTER — Encounter: Payer: Self-pay | Admitting: Urology

## 2021-01-10 DIAGNOSIS — E1139 Type 2 diabetes mellitus with other diabetic ophthalmic complication: Secondary | ICD-10-CM | POA: Diagnosis not present

## 2021-01-10 DIAGNOSIS — K219 Gastro-esophageal reflux disease without esophagitis: Secondary | ICD-10-CM | POA: Insufficient documentation

## 2021-01-10 DIAGNOSIS — G4730 Sleep apnea, unspecified: Secondary | ICD-10-CM | POA: Insufficient documentation

## 2021-01-10 DIAGNOSIS — N189 Chronic kidney disease, unspecified: Secondary | ICD-10-CM | POA: Diagnosis not present

## 2021-01-10 DIAGNOSIS — N201 Calculus of ureter: Secondary | ICD-10-CM | POA: Diagnosis not present

## 2021-01-10 DIAGNOSIS — I129 Hypertensive chronic kidney disease with stage 1 through stage 4 chronic kidney disease, or unspecified chronic kidney disease: Secondary | ICD-10-CM | POA: Insufficient documentation

## 2021-01-10 DIAGNOSIS — N401 Enlarged prostate with lower urinary tract symptoms: Secondary | ICD-10-CM | POA: Diagnosis not present

## 2021-01-10 DIAGNOSIS — E1122 Type 2 diabetes mellitus with diabetic chronic kidney disease: Secondary | ICD-10-CM | POA: Diagnosis not present

## 2021-01-10 DIAGNOSIS — Z888 Allergy status to other drugs, medicaments and biological substances status: Secondary | ICD-10-CM | POA: Insufficient documentation

## 2021-01-10 DIAGNOSIS — N202 Calculus of kidney with calculus of ureter: Secondary | ICD-10-CM | POA: Diagnosis present

## 2021-01-10 DIAGNOSIS — Z79899 Other long term (current) drug therapy: Secondary | ICD-10-CM | POA: Diagnosis not present

## 2021-01-10 HISTORY — PX: EXTRACORPOREAL SHOCK WAVE LITHOTRIPSY: SHX1557

## 2021-01-10 LAB — GLUCOSE, CAPILLARY: Glucose-Capillary: 138 mg/dL — ABNORMAL HIGH (ref 70–99)

## 2021-01-10 SURGERY — LITHOTRIPSY, ESWL
Anesthesia: Moderate Sedation | Laterality: Right

## 2021-01-10 MED ORDER — ONDANSETRON HCL 4 MG/2ML IJ SOLN: 4.0000 mg | Freq: Once | INTRAMUSCULAR | Status: DC | PRN

## 2021-01-10 MED ORDER — DIPHENHYDRAMINE HCL 25 MG PO CAPS: 25.0000 mg | ORAL_CAPSULE | ORAL | Status: DC

## 2021-01-10 MED ORDER — CEPHALEXIN 250 MG PO CAPS: 500.0000 mg | ORAL_CAPSULE | ORAL | Status: AC

## 2021-01-10 MED ORDER — CEPHALEXIN 500 MG PO CAPS
ORAL_CAPSULE | ORAL | Status: AC
  Administered 2021-01-10: 500 mg via ORAL
  Filled 2021-01-10: qty 1

## 2021-01-10 MED ORDER — SODIUM CHLORIDE 0.9 % IV SOLN: INTRAVENOUS | Status: DC

## 2021-01-10 MED ORDER — DIAZEPAM 5 MG PO TABS
ORAL_TABLET | ORAL | Status: AC
  Administered 2021-01-10: 10 mg via ORAL
  Filled 2021-01-10: qty 2

## 2021-01-10 MED ORDER — OXYCODONE-ACETAMINOPHEN 5-325 MG PO TABS: 1.0000 | ORAL_TABLET | Freq: Four times a day (QID) | ORAL | 0 refills | Status: DC | PRN

## 2021-01-10 MED ORDER — ONDANSETRON HCL 4 MG/2ML IJ SOLN: 4.0000 mg | Freq: Once | INTRAMUSCULAR | Status: AC

## 2021-01-10 MED ORDER — DIPHENHYDRAMINE HCL 25 MG PO CAPS: 25.0000 mg | ORAL_CAPSULE | Freq: Once | ORAL | Status: AC

## 2021-01-10 MED ORDER — CEPHALEXIN 500 MG PO CAPS: 500.0000 mg | ORAL_CAPSULE | Freq: Once | ORAL | Status: AC

## 2021-01-10 MED ORDER — TAMSULOSIN HCL 0.4 MG PO CAPS: 0.4000 mg | ORAL_CAPSULE | Freq: Every day | ORAL | 0 refills | Status: DC

## 2021-01-10 MED ORDER — DIPHENHYDRAMINE HCL 25 MG PO CAPS
ORAL_CAPSULE | ORAL | Status: AC
  Administered 2021-01-10: 25 mg via ORAL
  Filled 2021-01-10: qty 1

## 2021-01-10 MED ORDER — ONDANSETRON HCL 4 MG/2ML IJ SOLN
INTRAMUSCULAR | Status: AC
  Administered 2021-01-10: 4 mg via INTRAVENOUS
  Filled 2021-01-10: qty 2

## 2021-01-10 MED ORDER — DIAZEPAM 5 MG PO TABS: 10.0000 mg | ORAL_TABLET | Freq: Once | ORAL | Status: AC

## 2021-01-10 MED ORDER — SODIUM CHLORIDE 0.9 % IV SOLN
INTRAVENOUS | Status: DC
Start: 2021-01-10 — End: 2021-01-10

## 2021-01-10 MED ORDER — DIAZEPAM 5 MG PO TABS: 10.0000 mg | ORAL_TABLET | ORAL | Status: DC

## 2021-01-10 NOTE — Interval H&P Note (Signed)
History and Physical Interval Note:  01/10/2021 8:34 AM  Devon Valencia  has presented today for surgery, with the diagnosis of Kidney stone.  The various methods of treatment have been discussed with the patient and family. After consideration of risks, benefits and other options for treatment, the patient has consented to  Procedure(s): EXTRACORPOREAL SHOCK WAVE LITHOTRIPSY (ESWL) (Right) as a surgical intervention.  The patient's history has been reviewed, patient examined, no change in status, stable for surgery.  I have reviewed the patient's chart and labs.  Questions were answered to the patient's satisfaction.     Mccayla Shimada C Addi Pak

## 2021-01-10 NOTE — Discharge Instructions (Addendum)
AMBULATORY SURGERY  DISCHARGE INSTRUCTIONS   The drugs that you were given will stay in your system until tomorrow so for the next 24 hours you should not:  Drive an automobile Make any legal decisions Drink any alcoholic beverage   You may resume regular meals tomorrow.  Today it is better to start with liquids and gradually work up to solid foods.  You may eat anything you prefer, but it is better to start with liquids, then soup and crackers, and gradually work up to solid foods.   Please notify your doctor immediately if you have any unusual bleeding, trouble breathing, redness and pain at the surgery site, drainage, fever, or pain not relieved by medication.     Your post-operative visit with Hilton Sinclair, PA-C                                  is: Date:         01/24/2021               time: 930  Please call to schedule your post-operative visit.  Additional Instructions: As per the [facility] discharge instructions.  Prescription for pain medication was sent to [facility].  Tamsulosin will help you pass stone fragments.  If you do not have any at home Rx was also sent for this medication.  Call office for pain not controlled with oral medication or development of fever greater than 100.5 degrees

## 2021-01-15 ENCOUNTER — Ambulatory Visit: Payer: Federal, State, Local not specified - PPO

## 2021-01-24 ENCOUNTER — Ambulatory Visit
Admission: RE | Admit: 2021-01-24 | Discharge: 2021-01-24 | Disposition: A | Discharge status: Home / Self Care | Payer: Medicare Other | Source: Ambulatory Visit | Attending: Physician Assistant | Admitting: Physician Assistant

## 2021-01-24 ENCOUNTER — Other Ambulatory Visit: Payer: Self-pay

## 2021-01-24 ENCOUNTER — Ambulatory Visit (INDEPENDENT_AMBULATORY_CARE_PROVIDER_SITE_OTHER): Payer: Medicare Other | Admitting: Physician Assistant

## 2021-01-24 ENCOUNTER — Encounter: Payer: Self-pay | Admitting: Physician Assistant

## 2021-01-24 ENCOUNTER — Ambulatory Visit
Admission: RE | Admit: 2021-01-24 | Discharge: 2021-01-24 | Disposition: A | Discharge status: Home / Self Care | Payer: Medicare Other | Attending: Urology | Admitting: Urology

## 2021-01-24 ENCOUNTER — Other Ambulatory Visit: Payer: Self-pay | Admitting: Physician Assistant

## 2021-01-24 VITALS — BP 146/80 | HR 71 | Ht 66.0 in | Wt 198.0 lb

## 2021-01-24 DIAGNOSIS — N201 Calculus of ureter: Secondary | ICD-10-CM

## 2021-01-24 DIAGNOSIS — N401 Enlarged prostate with lower urinary tract symptoms: Secondary | ICD-10-CM | POA: Diagnosis not present

## 2021-01-24 DIAGNOSIS — N138 Other obstructive and reflux uropathy: Secondary | ICD-10-CM | POA: Diagnosis not present

## 2021-01-24 LAB — URINALYSIS, COMPLETE
Bilirubin, UA: NEGATIVE
Ketones, UA: NEGATIVE
Leukocytes,UA: NEGATIVE
Nitrite, UA: NEGATIVE
Protein,UA: NEGATIVE
RBC, UA: NEGATIVE
Specific Gravity, UA: 1.015 (ref 1.005–1.030)
Urobilinogen, Ur: 0.2 mg/dL (ref 0.2–1.0)
pH, UA: 5.5 (ref 5.0–7.5)

## 2021-01-24 LAB — MICROSCOPIC EXAMINATION
Bacteria, UA: NONE SEEN
Epithelial Cells (non renal): NONE SEEN /hpf (ref 0–10)
RBC, Urine: NONE SEEN /hpf (ref 0–2)
WBC, UA: NONE SEEN /hpf (ref 0–5)

## 2021-01-24 MED ORDER — TAMSULOSIN HCL 0.4 MG PO CAPS: 0.4000 mg | ORAL_CAPSULE | Freq: Every day | ORAL | 11 refills | Status: DC

## 2021-01-24 NOTE — Progress Notes (Signed)
01/24/2021 9:36 AM   Cleda Clarks [birth year] [id number]  CC: Chief Complaint  Patient presents with   Nephrolithiasis   HPI: Willow Reczek is a 70 y.o. male with PMH BPH with LUTS, elevated PSA, and nephrolithiasis s/p ESWL with Dr. Lonna Cobb on 01/10/2021 for management of an incidental 5 x 7 mm proximal right ureteral stone who presents today for postop follow-up.  Operative note significant for smudging of the stone.  Today he reports having passed both sandy debris and numerous stone fragments as recently as 3 days ago.  He captured many of these fragments and brings them with him to clinic today for analysis.  On visual inspection, they measure approximately 4 x 7 mm.  He denies any flank pain.  He continues to take Flomax following his procedure and has noticed improvement in double voiding on this.  He wonders if he should continue it.  Additionally, he reports difficulty with long-term medication compliance and wonders if there are any risks and stopping and starting Flomax.  KUB today with a possible 5 mm persistent fragment, stable in location compared to prior.  In-office UA today positive for 2+ glucose; urine microscopy pan negative.  PMH: Past Medical History:  Diagnosis Date   Arthritis    Chronic kidney disease    Diabetes mellitus without complication (HCC)    GERD (gastroesophageal reflux disease)    Glaucoma    Hypercholesteremia    Hypertension    Nephrolithiasis    Sleep apnea     Surgical History: Past Surgical History:  Procedure Laterality Date   COLONOSCOPY WITH PROPOFOL N/A 12/24/2015   Procedure: COLONOSCOPY WITH PROPOFOL;  Surgeon: Christena Deem, MD;  Location: [facility] ENDOSCOPY;  Service: Endoscopy;  Laterality: N/A;   EXTRACORPOREAL SHOCK WAVE LITHOTRIPSY Left 05/05/2019   Procedure: EXTRACORPOREAL SHOCK WAVE LITHOTRIPSY (ESWL);  Surgeon: [name], MD;  Location: [facility] ORS;  Service: Urology;  Laterality: Left;   EXTRACORPOREAL SHOCK  WAVE LITHOTRIPSY Right 01/10/2021   Procedure: EXTRACORPOREAL SHOCK WAVE LITHOTRIPSY (ESWL);  Surgeon: [name], MD;  Location: [facility] ORS;  Service: Urology;  Laterality: Right;   fish bone removal     from throat   TONSILLECTOMY     WRIST SURGERY Right    Tendonitis    Home Medications:  Allergies as of 01/24/2021       Reactions   Lisinopril Other (See Comments), Cough, Nausea And Vomiting   cough cough        Medication List        Accurate as of January 24, 2021  9:36 AM. If you have any questions, ask your nurse or doctor.          Assure Comfort Lancets 28G Misc 1 each by Other route 2 (two) times daily Use 1 each twice daily as directed. DX E 11.65   OneTouch Delica Lancets 33G Misc USE TWICE DAILY   dapagliflozin propanediol 10 MG Tabs tablet Commonly known as: FARXIGA TAKE ONE TABLET BY MOUTH EVERY MORNING   Dulaglutide 1.5 MG/0.5ML Sopn Inject 1.5 mg into the skin every 7 (seven) days.   glucose blood test strip Use 1 strip via meter twice daily as directed. Dx. E11.65   losartan 50 MG tablet Commonly known as: COZAAR Take by mouth.   metFORMIN 500 MG 24 hr tablet Commonly known as: GLUCOPHAGE-XR Take by mouth.   oxyCODONE-acetaminophen 5-325 MG tablet Commonly known as: PERCOCET/ROXICET Take 1 tablet by mouth every 6 (six) hours as needed for severe  pain.   rosuvastatin 20 MG tablet Commonly known as: CRESTOR Take by mouth.   tamsulosin 0.4 MG Caps capsule Commonly known as: FLOMAX Take 1 capsule (0.4 mg total) by mouth daily.        Allergies:  Allergies  Allergen Reactions   Lisinopril Other (See Comments), Cough and Nausea And Vomiting    cough cough    Family History: No family history on file.  Social History:   reports that he has never smoked. He has never used smokeless tobacco. He reports that he does not drink alcohol and does not use drugs.  Physical Exam: BP (!) 146/80   Pulse 71   Ht 5\' 6"  (1.676 m)   Wt  198 lb (89.8 kg)   BMI 31.96 kg/m   Constitutional:  Alert and oriented, no acute distress, nontoxic appearing HEENT: [place], AT Cardiovascular: No clubbing, cyanosis, or edema Respiratory: Normal respiratory effort, no increased work of breathing Skin: No rashes, bruises or suspicious lesions Neurologic: Grossly intact, no focal deficits, moving all 4 extremities Psychiatric: Normal mood and affect  Laboratory Data: Results for orders placed or performed in visit on 01/24/21  Microscopic Examination   Urine  Result Value Ref Range   WBC, UA None seen 0 - 5 /hpf   RBC None seen 0 - 2 /hpf   Epithelial Cells (non renal) None seen 0 - 10 /hpf   Bacteria, UA None seen None seen/Few  Urinalysis, Complete  Result Value Ref Range   Specific Gravity, UA 1.015 1.005 - 1.030   pH, UA 5.5 5.0 - 7.5   Color, UA Yellow Yellow   Appearance Ur Clear Clear   Leukocytes,UA Negative Negative   Protein,UA Negative Negative/Trace   Glucose, UA 2+ (A) Negative   Ketones, UA Negative Negative   RBC, UA Negative Negative   Bilirubin, UA Negative Negative   Urobilinogen, Ur 0.2 0.2 - 1.0 mg/dL   Nitrite, UA Negative Negative   Microscopic Examination See below:    Pertinent Imaging: KUB, 01/24/2021: CLINICAL DATA:  Kidney stones.   EXAM: ABDOMEN - 1 VIEW   COMPARISON:  January 11, 2019   FINDINGS: The kidneys are significantly obscured by bowel contents. An 8 mm stone projects over the lower pole the right kidney. Multiple stones are seen throughout the left kidney. At least 9 stones are seen. There is a cluster of 3 stones in the lower pole with the largest measuring up to 8 mm. No ureteral stones noted. Phleboliths in the pelvis. No other abnormalities.   IMPRESSION: Renal stones.  No ureteral stones identified.     Electronically Signed   By: January 13, 2019 III M.D   On: 01/26/2021 18:08  I personally reviewed the images referenced above and note a possible residual fragment in the  right ureter stable in position compared to prior.  Assessment & Plan:   1. Right ureteral calculus Patient has passed numerous fragments and UA is clear today.  Despite possible residual fragment on KUB today, I have low suspicion for clinically significant residual fragments given the volume of fragments produced today.  Regardless, we will obtain renal ultrasound to rule out any persistent right hydronephrosis and contact patient with results. - Urinalysis, Complete - Calculi, with Photograph (to Clinical Lab) - 01/28/2021 RENAL; Future  2. Benign prostatic hyperplasia with urinary obstruction Voiding symptoms improved on Flomax.  We discussed that it would be okay for him to continue this medication indefinitely.  I explained the Flomax can sometimes  cause orthostasis especially with initiation of this medication and that stopping and starting the medication over time would increase the frequency of this possibility, however patient tolerates medication well so overall I am not very concerned about it.  Patient is in agreement with continuing the med. - tamsulosin (FLOMAX) 0.4 MG CAPS capsule; Take 1 capsule (0.4 mg total) by mouth daily.  Dispense: 30 capsule; Refill: 11   Return if symptoms worsen or fail to improve, for Will call with results.  Carman Ching, PA-C  [facility] [address] [place], [place] [address] [phone]

## 2021-01-30 LAB — CALCULI, WITH PHOTOGRAPH (CLINICAL LAB)
Calcium Oxalate Monohydrate: 100 %
Weight Calculi: 162 mg

## 2021-02-12 ENCOUNTER — Other Ambulatory Visit: Payer: Self-pay

## 2021-02-12 ENCOUNTER — Ambulatory Visit
Admission: RE | Admit: 2021-02-12 | Discharge: 2021-02-12 | Disposition: A | Discharge status: Home / Self Care | Payer: Medicare Other | Source: Ambulatory Visit | Attending: Physician Assistant | Admitting: Physician Assistant

## 2021-02-12 DIAGNOSIS — N201 Calculus of ureter: Secondary | ICD-10-CM | POA: Diagnosis present

## 2021-02-15 ENCOUNTER — Telehealth: Payer: Self-pay

## 2021-02-15 NOTE — Telephone Encounter (Signed)
Notified patient as advised, patient expressed understanding.  

## 2021-02-15 NOTE — Telephone Encounter (Signed)
-----   Message from Carman Ching, [place] sent at 02/15/2021  8:22 AM EDT ----- Hydronephrosis has resolved, excellent news! No further treatment indicated, ok to follow up as planned. ----- Message ----- From: Interface, Rad Results In Sent: 02/13/2021  11:25 PM EDT To: Carman Ching, PA-C

## 2021-06-28 ENCOUNTER — Ambulatory Visit (INDEPENDENT_AMBULATORY_CARE_PROVIDER_SITE_OTHER): Payer: Medicare Other | Admitting: Podiatry

## 2021-06-28 ENCOUNTER — Other Ambulatory Visit: Payer: Self-pay

## 2021-06-28 ENCOUNTER — Encounter: Payer: Self-pay | Admitting: Podiatry

## 2021-06-28 VITALS — BP 135/79 | HR 77 | Temp 97.7°F | Resp 20

## 2021-06-28 DIAGNOSIS — E0843 Diabetes mellitus due to underlying condition with diabetic autonomic (poly)neuropathy: Secondary | ICD-10-CM

## 2021-06-28 DIAGNOSIS — M79674 Pain in right toe(s): Secondary | ICD-10-CM | POA: Diagnosis not present

## 2021-06-28 DIAGNOSIS — M79675 Pain in left toe(s): Secondary | ICD-10-CM

## 2021-06-28 DIAGNOSIS — B351 Tinea unguium: Secondary | ICD-10-CM

## 2021-06-28 NOTE — Progress Notes (Signed)
° °  SUBJECTIVE Patient with a history of diabetes mellitus presents to office today for evaluation of routine diabetic foot evaluation.  Patient is unable to trim their own nails.  He is requesting a nail trim today as well.  Patient is here for further evaluation and treatment.   Past Medical History:  Diagnosis Date   Arthritis    Chronic kidney disease    Diabetes mellitus without complication (HCC)    GERD (gastroesophageal reflux disease)    Glaucoma    Hypercholesteremia    Hypertension    Nephrolithiasis    Sleep apnea     OBJECTIVE General Patient is awake, alert, and oriented x 3 and in no acute distress. Derm Skin is dry and supple bilateral. Negative open lesions or macerations. Remaining integument unremarkable. Nails are tender, long, thickened and dystrophic with subungual debris, consistent with onychomycosis, 1-5 bilateral. No signs of infection noted. Vasc  DP and PT pedal pulses palpable bilaterally. Temperature gradient within normal limits.  Neuro light touch and protective threshold sensation intact bilaterally.  Musculoskeletal Exam No symptomatic pedal deformities noted bilateral. Muscular strength within normal limits.  ASSESSMENT 1. Diabetes Mellitus w/ peripheral neuropathy; controlled 2.  Pain due to onychomycosis of toenails bilateral  PLAN OF CARE 1. Patient evaluated today.  Comprehensive diabetic foot exam performed 2. Instructed to maintain good pedal hygiene and foot care. Stressed importance of controlling blood sugar.  3. Mechanical debridement of nails 1-5 bilaterally performed using a nail nipper. Filed with dremel without incident.  4. Return to clinic annually    Felecia Shelling, DPM [facility]  Dr. Felecia Shelling, DPM    [address] [place], [place] [address]                Office [phone]  Fax [phone]

## 2021-12-26 ENCOUNTER — Other Ambulatory Visit: Payer: Self-pay | Admitting: Family Medicine

## 2021-12-26 DIAGNOSIS — N201 Calculus of ureter: Secondary | ICD-10-CM

## 2021-12-27 ENCOUNTER — Encounter: Payer: Self-pay | Admitting: Urology

## 2021-12-27 ENCOUNTER — Ambulatory Visit (INDEPENDENT_AMBULATORY_CARE_PROVIDER_SITE_OTHER): Payer: Medicare Other | Admitting: Urology

## 2021-12-27 ENCOUNTER — Ambulatory Visit
Admission: RE | Admit: 2021-12-27 | Discharge: 2021-12-27 | Disposition: A | Discharge status: Home / Self Care | Payer: Medicare Other | Attending: Urology | Admitting: Urology

## 2021-12-27 ENCOUNTER — Ambulatory Visit
Admission: RE | Admit: 2021-12-27 | Discharge: 2021-12-27 | Disposition: A | Discharge status: Home / Self Care | Payer: Medicare Other | Source: Ambulatory Visit | Attending: Urology | Admitting: Urology

## 2021-12-27 VITALS — BP 129/77 | HR 72 | Ht 66.0 in | Wt 195.0 lb

## 2021-12-27 DIAGNOSIS — N401 Enlarged prostate with lower urinary tract symptoms: Secondary | ICD-10-CM | POA: Diagnosis not present

## 2021-12-27 DIAGNOSIS — N201 Calculus of ureter: Secondary | ICD-10-CM | POA: Insufficient documentation

## 2021-12-27 DIAGNOSIS — N200 Calculus of kidney: Secondary | ICD-10-CM | POA: Diagnosis not present

## 2021-12-27 DIAGNOSIS — N138 Other obstructive and reflux uropathy: Secondary | ICD-10-CM

## 2021-12-27 DIAGNOSIS — Z87898 Personal history of other specified conditions: Secondary | ICD-10-CM

## 2021-12-27 MED ORDER — TAMSULOSIN HCL 0.4 MG PO CAPS: 0.4000 mg | ORAL_CAPSULE | Freq: Every day | ORAL | 11 refills | Status: DC

## 2021-12-27 NOTE — Progress Notes (Signed)
12/27/2021 12:38 PM   Devon Valencia May 21, 1951 [id number]  Referring provider: Lauro Regulus, MD [address] [facility] [place],  [place] [address]  Chief Complaint  Patient presents with   Nephrolithiasis    Urologic history:    1. Bilateral nephrolithiasis  -ESWL 04/2019 5 mm left proximal ureteral calculus -Last CT 05/2019 bilateral renal calculi; largest 11 mm left kidney - SWL 7 mm right proximal ureteral calculus 01/10/2021   2.  History elevated PSA -Previous biopsy around 2010 which was benign.  PSA at time of biopsy unknown; subsequent PSAs normal and stable   3.  BPH with mild lower urinary tract symptoms -Tamsulosin  HPI: 71 y.o. male presents for annual follow-up.  No problems since SWL June 2022 Follow-up renal ultrasound in July showed no hydronephrosis Stable LUTS on tamsulosin No dysuria, gross hematuria No flank, abdominal or pelvic pain   PMH: Past Medical History:  Diagnosis Date   Arthritis    Chronic kidney disease    Diabetes mellitus without complication (HCC)    GERD (gastroesophageal reflux disease)    Glaucoma    Hypercholesteremia    Hypertension    Nephrolithiasis    Sleep apnea     Surgical History: Past Surgical History:  Procedure Laterality Date   COLONOSCOPY WITH PROPOFOL N/A 12/24/2015   Procedure: COLONOSCOPY WITH PROPOFOL;  Surgeon: Christena Deem, MD;  Location[facility] ENDOSCOPY;  Service: Endoscopy;  Laterality: N/A;   EXTRACORPOREAL SHOCK WAVE LITHOTRIPSY Left 05/05/2019   Procedure: EXTRACORPOREAL SHOCK WAVE LITHOTRIPSY (ESWL);  Surgeon: [name], MD;  Location: [facility] ORS;  Service: Urology;  Laterality: Left;   EXTRACORPOREAL SHOCK WAVE LITHOTRIPSY Right 01/10/2021   Procedure: EXTRACORPOREAL SHOCK WAVE LITHOTRIPSY (ESWL);  Surgeon: [name], MD;  Location: [facility] ORS;  Service: Urology;  Laterality: Right;   fish bone removal     from throat   TONSILLECTOMY     WRIST  SURGERY Right    Tendonitis    Home Medications:  Allergies as of 12/27/2021       Reactions   Lisinopril Other (See Comments), Cough, Nausea And Vomiting   cough cough        Medication List        Accurate as of December 27, 2021 12:38 PM. If you have any questions, ask your nurse or doctor.          STOP taking these medications    Dulaglutide 1.5 MG/0.5ML Sopn Stopped by: [name], MD   oxyCODONE-acetaminophen 5-325 MG tablet Commonly known as: PERCOCET/ROXICET Stopped by: [name], MD       TAKE these medications    Assure Comfort Lancets 28G Misc 1 each by Other route 2 (two) times daily Use 1 each twice daily as directed. DX E 11.65   OneTouch Delica Lancets 33G Misc USE TWICE DAILY   dapagliflozin propanediol 10 MG Tabs tablet Commonly known as: FARXIGA TAKE ONE TABLET BY MOUTH EVERY MORNING   glucose blood test strip Use 1 strip via meter twice daily as directed. Dx. E11.65   losartan 50 MG tablet Commonly known as: COZAAR Take by mouth.   metFORMIN 500 MG 24 hr tablet Commonly known as: GLUCOPHAGE-XR Take by mouth.   Ozempic (1 MG/DOSE) 4 MG/3ML Sopn Generic drug: Semaglutide (1 MG/DOSE)   rosuvastatin 20 MG tablet Commonly known as: CRESTOR Take by mouth.   tamsulosin 0.4 MG Caps capsule Commonly known as: FLOMAX Take 1 capsule (0.4  mg total) by mouth daily.        Allergies:  Allergies  Allergen Reactions   Lisinopril Other (See Comments), Cough and Nausea And Vomiting    cough cough    Family History: History reviewed. No pertinent family history.  Social History:  reports that he has never smoked. He has never used smokeless tobacco. He reports current alcohol use. He reports that he does not use drugs.   Physical Exam: BP 129/77   Pulse 72   Ht 5\' 6"  (1.676 m)   Wt 195 lb (88.5 kg)   BMI 31.47 kg/m   Constitutional:  Alert and oriented, No acute distress. HEENT: [place] AT, moist mucus membranes.   Trachea midline, no masses. Cardiovascular: No clubbing, cyanosis, or edema. Respiratory: Normal respiratory effort, no increased work of breathing. GU: Prostate 50 g, smooth without nodules Skin: No rashes, bruises or suspicious lesions. Neurologic: Grossly intact, no focal deficits, moving all 4 extremities. Psychiatric: Normal mood and affect.   Pertinent Imaging:  Images of a KUB performed today were personally reviewed and interpreted.  Stable bilateral renal calculi   Assessment & Plan:    1. Nephrolithiasis Stable bilateral nephrolithiasis  2.  History elevated PSA He desires to continue annual PSA checks Benign DRE  3.  BPH with LUTS Stable on tamsulosin; refill sent to pharmacy  Continue annual follow-up   , MD  [facility] [address], [address] [place], [place] [phone]

## 2021-12-28 LAB — PSA: Prostate Specific Ag, Serum: 2.4 ng/mL (ref 0.0–4.0)

## 2021-12-30 ENCOUNTER — Telehealth: Payer: Self-pay

## 2021-12-30 NOTE — Telephone Encounter (Signed)
-----   Message from [name], MD sent at 12/30/2021  9:50 AM EDT ----- PSA stable 2.4

## 2021-12-30 NOTE — Telephone Encounter (Signed)
Mychart notification sent 

## 2022-02-24 ENCOUNTER — Ambulatory Visit
Admission: RE | Admit: 2022-02-24 | Discharge: 2022-02-24 | Disposition: A | Discharge status: Home / Self Care | Payer: Medicare Other | Attending: Gastroenterology | Admitting: Gastroenterology

## 2022-02-24 ENCOUNTER — Encounter: Payer: Self-pay | Admitting: *Deleted

## 2022-02-24 ENCOUNTER — Ambulatory Visit: Payer: Medicare Other | Admitting: Certified Registered"

## 2022-02-24 ENCOUNTER — Encounter
Admission: RE | Disposition: A | Discharge status: Home / Self Care | Payer: Self-pay | Source: Home / Self Care | Attending: Gastroenterology

## 2022-02-24 ENCOUNTER — Other Ambulatory Visit: Payer: Self-pay

## 2022-02-24 DIAGNOSIS — E1122 Type 2 diabetes mellitus with diabetic chronic kidney disease: Secondary | ICD-10-CM | POA: Insufficient documentation

## 2022-02-24 DIAGNOSIS — N189 Chronic kidney disease, unspecified: Secondary | ICD-10-CM | POA: Diagnosis not present

## 2022-02-24 DIAGNOSIS — K5730 Diverticulosis of large intestine without perforation or abscess without bleeding: Secondary | ICD-10-CM | POA: Diagnosis not present

## 2022-02-24 DIAGNOSIS — K640 First degree hemorrhoids: Secondary | ICD-10-CM | POA: Insufficient documentation

## 2022-02-24 DIAGNOSIS — G4730 Sleep apnea, unspecified: Secondary | ICD-10-CM | POA: Insufficient documentation

## 2022-02-24 DIAGNOSIS — Z1211 Encounter for screening for malignant neoplasm of colon: Secondary | ICD-10-CM | POA: Diagnosis present

## 2022-02-24 DIAGNOSIS — Z7985 Long-term (current) use of injectable non-insulin antidiabetic drugs: Secondary | ICD-10-CM | POA: Diagnosis not present

## 2022-02-24 DIAGNOSIS — K219 Gastro-esophageal reflux disease without esophagitis: Secondary | ICD-10-CM | POA: Diagnosis not present

## 2022-02-24 DIAGNOSIS — I739 Peripheral vascular disease, unspecified: Secondary | ICD-10-CM | POA: Diagnosis not present

## 2022-02-24 DIAGNOSIS — Z7984 Long term (current) use of oral hypoglycemic drugs: Secondary | ICD-10-CM | POA: Diagnosis not present

## 2022-02-24 DIAGNOSIS — I129 Hypertensive chronic kidney disease with stage 1 through stage 4 chronic kidney disease, or unspecified chronic kidney disease: Secondary | ICD-10-CM | POA: Diagnosis not present

## 2022-02-24 HISTORY — PX: COLONOSCOPY WITH PROPOFOL: SHX5780

## 2022-02-24 LAB — GLUCOSE, CAPILLARY: Glucose-Capillary: 111 mg/dL — ABNORMAL HIGH (ref 70–99)

## 2022-02-24 SURGERY — COLONOSCOPY WITH PROPOFOL
Anesthesia: General

## 2022-02-24 MED ORDER — PROPOFOL 500 MG/50ML IV EMUL: INTRAVENOUS | Status: DC | PRN

## 2022-02-24 MED ORDER — SUCCINYLCHOLINE CHLORIDE 200 MG/10ML IV SOSY
PREFILLED_SYRINGE | INTRAVENOUS | Status: DC | PRN
  Administered 2022-02-24: 140 mg via INTRAVENOUS

## 2022-02-24 MED ORDER — LIDOCAINE HCL (CARDIAC) PF 100 MG/5ML IV SOSY
PREFILLED_SYRINGE | INTRAVENOUS | Status: DC | PRN
  Administered 2022-02-24: 100 mg via INTRAVENOUS

## 2022-02-24 MED ORDER — PROPOFOL 10 MG/ML IV BOLUS
INTRAVENOUS | Status: DC | PRN
  Administered 2022-02-24: 150 mg via INTRAVENOUS
  Administered 2022-02-24: 50 mg via INTRAVENOUS

## 2022-02-24 MED ORDER — GLYCOPYRROLATE 0.2 MG/ML IJ SOLN
INTRAMUSCULAR | Status: DC | PRN
  Administered 2022-02-24: .2 mg via INTRAVENOUS

## 2022-02-24 MED ORDER — DEXMEDETOMIDINE HCL IN NACL 80 MCG/20ML IV SOLN
INTRAVENOUS | Status: AC
  Filled 2022-02-24: qty 20

## 2022-02-24 MED ORDER — PROPOFOL 1000 MG/100ML IV EMUL
INTRAVENOUS | Status: AC
  Filled 2022-02-24: qty 100

## 2022-02-24 MED ORDER — PHENYLEPHRINE HCL (PRESSORS) 10 MG/ML IV SOLN
INTRAVENOUS | Status: DC | PRN
  Administered 2022-02-24: 160 ug via INTRAVENOUS
  Administered 2022-02-24: 12 ug via INTRAVENOUS
  Administered 2022-02-24: 120 ug via INTRAVENOUS

## 2022-02-24 MED ORDER — LIDOCAINE HCL (PF) 2 % IJ SOLN
INTRAMUSCULAR | Status: AC
  Filled 2022-02-24: qty 5

## 2022-02-24 MED ORDER — DEXMEDETOMIDINE HCL IN NACL 200 MCG/50ML IV SOLN
INTRAVENOUS | Status: DC | PRN
  Administered 2022-02-24: 8 ug via INTRAVENOUS
  Administered 2022-02-24: 12 ug via INTRAVENOUS

## 2022-02-24 MED ORDER — SODIUM CHLORIDE 0.9 % IV SOLN: INTRAVENOUS | Status: DC

## 2022-02-24 NOTE — H&P (Signed)
Outpatient short stay form Pre-procedure 02/24/2022  Regis Bill, MD  Primary Physician: Lauro Regulus, MD  Reason for visit:  Surveillance colonoscopy  History of present illness:    71 y/o gentleman with history of diabetes, sleep apnea, and hypertension here for surveillance colonoscopy due to history of large adenoma. Last colonoscopy in 2017 was unremarkable. No blood thinners. No family history of GI malignancies. No significant abdominal surgeries.    Current Facility-Administered Medications:    0.9 %  sodium chloride infusion, , Intravenous, Continuous, Avarie Tavano, Rossie Muskrat, MD, Last Rate: 20 mL/hr at 02/24/22 0726, New Bag at 02/24/22 0726  Medications Prior to Admission  Medication Sig Dispense Refill Last Dose   ASSURE COMFORT LANCETS 28G MISC 1 each by Other route 2 (two) times daily Use 1 each twice daily as directed. DX E 11.65   Past Week   dapagliflozin propanediol (FARXIGA) 10 MG TABS tablet TAKE ONE TABLET BY MOUTH EVERY MORNING   Past Week   glucose blood test strip Use 1 strip via meter twice daily as directed. Dx. E11.65   Past Week   losartan (COZAAR) 50 MG tablet Take by mouth.   Past Week   metFORMIN (GLUCOPHAGE-XR) 500 MG 24 hr tablet Take by mouth.   Past Week   OneTouch Delica Lancets 33G MISC USE TWICE DAILY   Past Week   rosuvastatin (CRESTOR) 20 MG tablet Take by mouth.   Past Week   Semaglutide, 1 MG/DOSE, (OZEMPIC, 1 MG/DOSE,) 4 MG/3ML SOPN    02/22/2022   tamsulosin (FLOMAX) 0.4 MG CAPS capsule Take 1 capsule (0.4 mg total) by mouth daily. 30 capsule 11 Past Week     Allergies  Allergen Reactions   Lisinopril Other (See Comments), Cough and Nausea And Vomiting    cough cough     Past Medical History:  Diagnosis Date   Arthritis    Chronic kidney disease    Diabetes mellitus without complication (HCC)    GERD (gastroesophageal reflux disease)    Glaucoma    Hypercholesteremia    Hypertension    Nephrolithiasis    Sleep apnea      Review of systems:  Otherwise negative.    Physical Exam  Gen: Alert, oriented. Appears stated age.  HEENT: PERRLA. Lungs: No respiratory distress CV: RRR Abd: soft, benign, no masses Ext: No edema    Planned procedures: Proceed with colonoscopy. The patient understands the nature of the planned procedure, indications, risks, alternatives and potential complications including but not limited to bleeding, infection, perforation, damage to internal organs and possible oversedation/side effects from anesthesia. The patient agrees and gives consent to proceed.  Please refer to procedure notes for findings, recommendations and patient disposition/instructions.     Regis Bill, MD [facility]

## 2022-02-24 NOTE — Anesthesia Preprocedure Evaluation (Signed)
Anesthesia Evaluation  Patient identified by MRN, date of birth, ID band Patient awake    Reviewed: Allergy & Precautions, NPO status , Patient's Chart, lab work & pertinent test results  Airway Mallampati: III  TM Distance: >3 FB Neck ROM: Full    Dental  (+) Teeth Intact, Implants   Pulmonary neg pulmonary ROS, sleep apnea and Continuous Positive Airway Pressure Ventilation ,    Pulmonary exam normal breath sounds clear to auscultation       Cardiovascular Exercise Tolerance: Good hypertension, Pt. on medications + Peripheral Vascular Disease  negative cardio ROS Normal cardiovascular exam Rhythm:Regular     Neuro/Psych negative neurological ROS  negative psych ROS   GI/Hepatic negative GI ROS, Neg liver ROS, GERD  Medicated,  Endo/Other  negative endocrine ROSdiabetes, Type 2, Oral Hypoglycemic AgentsOn ozempic!  Renal/GU      Musculoskeletal  (+) Arthritis ,   Abdominal Normal abdominal exam  (+)   Peds negative pediatric ROS (+)  Hematology negative hematology ROS (+)   Anesthesia Other Findings Past Medical History: No date: Arthritis No date: Chronic kidney disease No date: Diabetes mellitus without complication (HCC) No date: GERD (gastroesophageal reflux disease) No date: Glaucoma No date: Hypercholesteremia No date: Hypertension No date: Nephrolithiasis No date: Sleep apnea  Past Surgical History: 12/24/2015: COLONOSCOPY WITH PROPOFOL; N/A     Comment:  Procedure: COLONOSCOPY WITH PROPOFOL;  Surgeon: Christena Deem, MD;  Location: [facility] ENDOSCOPY;  Service:               Endoscopy;  Laterality: N/A; 05/05/2019: EXTRACORPOREAL SHOCK WAVE LITHOTRIPSY; Left     Comment:  Procedure: EXTRACORPOREAL SHOCK WAVE LITHOTRIPSY (ESWL);              Surgeon: [name], MD;  Location: [facility] ORS;                Service: Urology;  Laterality: Left; 01/10/2021: EXTRACORPOREAL SHOCK WAVE  LITHOTRIPSY; Right     Comment:  Procedure: EXTRACORPOREAL SHOCK WAVE LITHOTRIPSY (ESWL);              Surgeon: [name], MD;  Location: [facility] ORS;                Service: Urology;  Laterality: Right; No date: fish bone removal     Comment:  from throat No date: TONSILLECTOMY No date: WRIST SURGERY; Right     Comment:  Tendonitis  BMI    Body Mass Index: 32.95 kg/m      Reproductive/Obstetrics negative OB ROS                             Anesthesia Physical Anesthesia Plan  ASA: 3  Anesthesia Plan: General   Post-op Pain Management:    Induction: Intravenous  PONV Risk Score and Plan: Ondansetron, Dexamethasone, Midazolam and Treatment may vary due to age or medical condition  Airway Management Planned: Oral ETT  Additional Equipment:   Intra-op Plan:   Post-operative Plan: Extubation in OR  Informed Consent: I have reviewed the patients History and Physical, chart, labs and discussed the procedure including the risks, benefits and alternatives for the proposed anesthesia with the patient or authorized representative who has indicated his/her understanding and acceptance.     Dental Advisory Given  Plan Discussed with: CRNA and Surgeon  Anesthesia Plan Comments:  Anesthesia Quick Evaluation  

## 2022-02-24 NOTE — Anesthesia Procedure Notes (Signed)
Procedure Name: Intubation Date/Time: 02/24/2022 7:52 AM  Performed by: Carter Kitten, CRNAPre-anesthesia Checklist: Patient identified, Patient being monitored, Timeout performed, Emergency Drugs available and Suction available Patient Re-evaluated:Patient Re-evaluated prior to induction Oxygen Delivery Method: Circle system utilized Preoxygenation: Pre-oxygenation with 100% oxygen Induction Type: IV induction Ventilation: Mask ventilation without difficulty Laryngoscope Size: 3 and McGraph Grade View: Grade II Tube type: Oral Tube size: 7.0 mm Number of attempts: 1 Airway Equipment and Method: Stylet Placement Confirmation: ETT inserted through vocal cords under direct vision, positive ETCO2 and breath sounds checked- equal and bilateral Secured at: 23 cm Tube secured with: Tape Dental Injury: Teeth and Oropharynx as per pre-operative assessment

## 2022-02-24 NOTE — Op Note (Signed)
[facility] Gastroenterology Patient Name: Devon Valencia Procedure Date: 02/24/2022 7:49 AM MRN: [id number] Account #: [id number] Date of Birth: 1951-04-04 Admit Type: Outpatient Age: 71 Room: [facility] ENDO ROOM 3 Gender: Male Note Status: Finalized Instrument Name: Jasper Riling [id number] Procedure:             Colonoscopy Indications:           High risk colon cancer surveillance: Personal history                         of adenoma (10 mm or greater in size), Last                         colonoscopy 5 years ago Providers:             Andrey Farmer MD, MD Referring MD:          Ocie Cornfield. Ouida Sills MD, MD (Referring MD) Medicines:             Monitored Anesthesia Care Complications:         No immediate complications. Procedure:             Pre-Anesthesia Assessment:                        - Prior to the procedure, a History and Physical was                         performed, and patient medications and allergies were                         reviewed. The patient is competent. The risks and                         benefits of the procedure and the sedation options and                         risks were discussed with the patient. All questions                         were answered and informed consent was obtained.                         Patient identification and proposed procedure were                         verified by the physician, the nurse, the                         anesthesiologist, the anesthetist and the technician                         in the endoscopy suite. Mental Status Examination:                         alert and oriented. Airway Examination: normal                         oropharyngeal airway and neck mobility. Respiratory  Examination: clear to auscultation. CV Examination:                         normal. Prophylactic Antibiotics: The patient does not                         require prophylactic antibiotics. Prior                          Anticoagulants: The patient has taken no previous                         anticoagulant or antiplatelet agents. ASA Grade                         Assessment: II - A patient with mild systemic disease.                         After reviewing the risks and benefits, the patient                         was deemed in satisfactory condition to undergo the                         procedure. The anesthesia plan was to use monitored                         anesthesia care (MAC). Immediately prior to                         administration of medications, the patient was                         re-assessed for adequacy to receive sedatives. The                         heart rate, respiratory rate, oxygen saturations,                         blood pressure, adequacy of pulmonary ventilation, and                         response to care were monitored throughout the                         procedure. The physical status of the patient was                         re-assessed after the procedure.                        After obtaining informed consent, the colonoscope was                         passed under direct vision. Throughout the procedure,                         the patient's blood pressure, pulse, and oxygen  saturations were monitored continuously. The                         Colonoscope was introduced through the anus and                         advanced to the the cecum, identified by appendiceal                         orifice and ileocecal valve. The colonoscopy was                         performed without difficulty. The patient tolerated                         the procedure well. The quality of the bowel                         preparation was adequate to identify polyps. Findings:      The perianal and digital rectal examinations were normal.      A few small and large-mouthed diverticula were found in the sigmoid       colon.      Internal  hemorrhoids were found during retroflexion. The hemorrhoids       were Grade I (internal hemorrhoids that do not prolapse).      The exam was otherwise without abnormality on direct and retroflexion       views. Impression:            - Diverticulosis in the sigmoid colon.                        - Internal hemorrhoids.                        - The examination was otherwise normal on direct and                         retroflexion views.                        - No specimens collected. Recommendation:        - Discharge patient to home.                        - Resume previous diet.                        - Continue present medications.                        - Repeat colonoscopy is not recommended due to current                         age (52 years or older) for surveillance.                        - Return to referring physician as previously                         scheduled. Procedure Code(s):     ---  Professional ---                        F8101, Colorectal cancer screening; colonoscopy on                         individual at high risk Diagnosis Code(s):     --- Professional ---                        Z86.010, Personal history of colonic polyps                        K64.0, First degree hemorrhoids                        K57.30, Diverticulosis of large intestine without                         perforation or abscess without bleeding CPT copyright 2019 American Medical Association. All rights reserved. The codes documented in this report are preliminary and upon coder review may  be revised to meet current compliance requirements. Andrey Farmer MD, MD 02/24/2022 8:28:11 AM Number of Addenda: 0 Note Initiated On: 02/24/2022 7:49 AM Scope Withdrawal Time: 0 hours 11 minutes 4 seconds  Total Procedure Duration: 0 hours 20 minutes 19 seconds  Estimated Blood Loss:  Estimated blood loss: none.      [facility]

## 2022-02-24 NOTE — Anesthesia Postprocedure Evaluation (Signed)
Anesthesia Post Note  Patient: Devon Valencia  Procedure(s) Performed: COLONOSCOPY WITH PROPOFOL  Patient location during evaluation: PACU Anesthesia Type: General Level of consciousness: awake and awake and alert Pain management: pain level controlled Vital Signs Assessment: post-procedure vital signs reviewed and stable Respiratory status: spontaneous breathing Cardiovascular status: stable Anesthetic complications: no   No notable events documented.   Last Vitals:  Vitals:   02/24/22 0846 02/24/22 0856  BP: 121/83 (!) 149/91  Pulse: 90 74  Resp: 20 13  Temp:    SpO2: 99% 98%    Last Pain:  Vitals:   02/24/22 0856  TempSrc:   PainSc: 0-No pain                 VAN STAVEREN,Yoshika Vensel

## 2022-02-24 NOTE — Transfer of Care (Addendum)
Immediate Anesthesia Transfer of Care Note  Patient: Devon Valencia  Procedure(s) Performed: COLONOSCOPY WITH PROPOFOL  Patient Location: PACU and Endoscopy Unit  Anesthesia Type:General  Level of Consciousness: awake  Airway & Oxygen Therapy: Patient Spontanous Breathing  Post-op Assessment: Report given to RN  Post vital signs: stable  Last Vitals:  Vitals Value Taken Time  BP    Temp    Pulse    Resp    SpO2      Last Pain:  Vitals:   02/24/22 0713  TempSrc: Temporal  PainSc: 0-No pain         Complications: No notable events documented.

## 2022-02-24 NOTE — Interval H&P Note (Signed)
History and Physical Interval Note:  02/24/2022 7:50 AM  Devon Valencia  has presented today for surgery, with the diagnosis of HX OF ADENOMATOUS POLYP OF COLON.  The various methods of treatment have been discussed with the patient and family. After consideration of risks, benefits and other options for treatment, the patient has consented to  Procedure(s) with comments: COLONOSCOPY WITH PROPOFOL (N/A) - DM as a surgical intervention.  The patient's history has been reviewed, patient examined, no change in status, stable for surgery.  I have reviewed the patient's chart and labs.  Questions were answered to the patient's satisfaction.     Regis Bill  Ok to proceed with colonoscopy

## 2022-02-25 ENCOUNTER — Encounter: Payer: Self-pay | Admitting: Gastroenterology

## 2022-02-27 ENCOUNTER — Encounter: Payer: Self-pay | Admitting: Ophthalmology

## 2022-03-05 NOTE — Discharge Instructions (Signed)

## 2022-03-10 ENCOUNTER — Encounter
Admission: RE | Disposition: A | Discharge status: Home / Self Care | Payer: Self-pay | Source: Home / Self Care | Attending: Ophthalmology

## 2022-03-10 ENCOUNTER — Encounter: Payer: Self-pay | Admitting: Ophthalmology

## 2022-03-10 ENCOUNTER — Other Ambulatory Visit: Payer: Self-pay

## 2022-03-10 ENCOUNTER — Ambulatory Visit (AMBULATORY_SURGERY_CENTER): Payer: Medicare Other | Admitting: Anesthesiology

## 2022-03-10 ENCOUNTER — Ambulatory Visit
Admission: RE | Admit: 2022-03-10 | Discharge: 2022-03-10 | Disposition: A | Discharge status: Home / Self Care | Payer: Medicare Other | Attending: Ophthalmology | Admitting: Ophthalmology

## 2022-03-10 ENCOUNTER — Ambulatory Visit: Payer: Medicare Other | Admitting: Anesthesiology

## 2022-03-10 DIAGNOSIS — Z7985 Long-term (current) use of injectable non-insulin antidiabetic drugs: Secondary | ICD-10-CM | POA: Insufficient documentation

## 2022-03-10 DIAGNOSIS — N189 Chronic kidney disease, unspecified: Secondary | ICD-10-CM | POA: Diagnosis not present

## 2022-03-10 DIAGNOSIS — Z9989 Dependence on other enabling machines and devices: Secondary | ICD-10-CM

## 2022-03-10 DIAGNOSIS — E1122 Type 2 diabetes mellitus with diabetic chronic kidney disease: Secondary | ICD-10-CM | POA: Insufficient documentation

## 2022-03-10 DIAGNOSIS — I129 Hypertensive chronic kidney disease with stage 1 through stage 4 chronic kidney disease, or unspecified chronic kidney disease: Secondary | ICD-10-CM

## 2022-03-10 DIAGNOSIS — H2511 Age-related nuclear cataract, right eye: Secondary | ICD-10-CM | POA: Insufficient documentation

## 2022-03-10 DIAGNOSIS — G4730 Sleep apnea, unspecified: Secondary | ICD-10-CM | POA: Diagnosis not present

## 2022-03-10 DIAGNOSIS — E7800 Pure hypercholesterolemia, unspecified: Secondary | ICD-10-CM | POA: Diagnosis not present

## 2022-03-10 DIAGNOSIS — G4733 Obstructive sleep apnea (adult) (pediatric): Secondary | ICD-10-CM

## 2022-03-10 DIAGNOSIS — E1136 Type 2 diabetes mellitus with diabetic cataract: Secondary | ICD-10-CM | POA: Diagnosis not present

## 2022-03-10 DIAGNOSIS — Z7984 Long term (current) use of oral hypoglycemic drugs: Secondary | ICD-10-CM | POA: Insufficient documentation

## 2022-03-10 HISTORY — PX: CATARACT EXTRACTION W/PHACO: SHX586

## 2022-03-10 HISTORY — DX: Failed or difficult intubation, initial encounter: T88.4XXA

## 2022-03-10 LAB — GLUCOSE, CAPILLARY
Glucose-Capillary: 161 mg/dL — ABNORMAL HIGH (ref 70–99)
Glucose-Capillary: 152 mg/dL — ABNORMAL HIGH (ref 70–99)

## 2022-03-10 SURGERY — PHACOEMULSIFICATION, CATARACT, WITH IOL INSERTION
Anesthesia: Monitor Anesthesia Care | Site: Eye | Laterality: Right

## 2022-03-10 MED ORDER — SIGHTPATH DOSE#1 BSS IO SOLN
INTRAOCULAR | Status: DC | PRN
  Administered 2022-03-10: 15 mL via INTRAOCULAR

## 2022-03-10 MED ORDER — SIGHTPATH DOSE#1 NA HYALUR & NA CHOND-NA HYALUR IO KIT
PACK | INTRAOCULAR | Status: DC | PRN
  Administered 2022-03-10: 1 via OPHTHALMIC

## 2022-03-10 MED ORDER — TRIAMCINOLONE ACETONIDE 40 MG/ML IJ SUSP
INTRAMUSCULAR | Status: DC | PRN
  Administered 2022-03-10: .1 mL

## 2022-03-10 MED ORDER — MOXIFLOXACIN HCL 0.5 % OP SOLN
OPHTHALMIC | Status: DC | PRN
  Administered 2022-03-10: 0.2 mL via OPHTHALMIC

## 2022-03-10 MED ORDER — LIDOCAINE HCL (PF) 2 % IJ SOLN
INTRAOCULAR | Status: DC | PRN
  Administered 2022-03-10: 4 mL via INTRAOCULAR

## 2022-03-10 MED ORDER — TETRACAINE HCL 0.5 % OP SOLN
1.0000 [drp] | OPHTHALMIC | Status: DC | PRN
  Administered 2022-03-10 (×3): 1 [drp] via OPHTHALMIC

## 2022-03-10 MED ORDER — MIDAZOLAM HCL 2 MG/2ML IJ SOLN
INTRAMUSCULAR | Status: DC | PRN
  Administered 2022-03-10: 2 mg via INTRAVENOUS

## 2022-03-10 MED ORDER — ARMC OPHTHALMIC DILATING DROPS
1.0000 | OPHTHALMIC | Status: DC | PRN
  Administered 2022-03-10 (×3): 1 via OPHTHALMIC

## 2022-03-10 MED ORDER — LACTATED RINGERS IV SOLN: INTRAVENOUS | Status: DC

## 2022-03-10 MED ORDER — FENTANYL CITRATE (PF) 100 MCG/2ML IJ SOLN
INTRAMUSCULAR | Status: DC | PRN
  Administered 2022-03-10 (×2): 25 ug via INTRAVENOUS
  Administered 2022-03-10: 50 ug via INTRAVENOUS

## 2022-03-10 MED ORDER — SIGHTPATH DOSE#1 BSS IO SOLN
INTRAOCULAR | Status: DC | PRN
  Administered 2022-03-10: 88 mL via OPHTHALMIC

## 2022-03-10 SURGICAL SUPPLY — 19 items
CANNULA ANT/CHMB 27G (MISCELLANEOUS) IMPLANT
CANNULA ANT/CHMB 27GA (MISCELLANEOUS) IMPLANT
CATARACT SUITE SIGHTPATH (MISCELLANEOUS) ×1 IMPLANT
DISSECTOR HYDRO NUCLEUS 50X22 (MISCELLANEOUS) ×1 IMPLANT
FEE CATARACT SUITE SIGHTPATH (MISCELLANEOUS) ×1 IMPLANT
GLOVE SURG GAMMEX PI TX LF 7.5 (GLOVE) ×1 IMPLANT
GLOVE SURG SYN 8.5  E (GLOVE) ×1
GLOVE SURG SYN 8.5 E (GLOVE) ×1 IMPLANT
GLOVE SURG SYN 8.5 PF PI (GLOVE) ×1 IMPLANT
LENS IOL TECNIS EYHANCE 18.0 (Intraocular Lens) IMPLANT
NDL FILTER BLUNT 18X1 1/2 (NEEDLE) ×1 IMPLANT
NDL HYPO 27GX1-1/4 (NEEDLE) IMPLANT
NEEDLE FILTER BLUNT 18X 1/2SAF (NEEDLE) ×1
NEEDLE FILTER BLUNT 18X1 1/2 (NEEDLE) ×1 IMPLANT
NEEDLE HYPO 27GX1-1/4 (NEEDLE) ×1 IMPLANT
SYR 3ML LL SCALE MARK (SYRINGE) ×1 IMPLANT
SYR 5ML LL (SYRINGE) ×1 IMPLANT
SYR TB 1ML LUER SLIP (SYRINGE) IMPLANT
WATER STERILE IRR 250ML POUR (IV SOLUTION) ×1 IMPLANT

## 2022-03-10 NOTE — H&P (Signed)
[facility]   Primary Care Physician:  Lauro Regulus, MD Ophthalmologist: Dr. Willey Blade  Pre-Procedure History & Physical: HPI:  Devon Valencia is a 71 y.o. male here for cataract surgery.   Past Medical History:  Diagnosis Date   Arthritis    Chronic kidney disease    Diabetes mellitus without complication (HCC)    Difficult intubation    pt reports he was told 02/24/22 that he was difficult to intubate.   GERD (gastroesophageal reflux disease)    Glaucoma    Hypercholesteremia    Hypertension    Nephrolithiasis    Sleep apnea    CPAP    Past Surgical History:  Procedure Laterality Date   COLONOSCOPY WITH PROPOFOL N/A 12/24/2015   Procedure: COLONOSCOPY WITH PROPOFOL;  Surgeon: Christena Deem, MD;  Location: [facility] ENDOSCOPY;  Service: Endoscopy;  Laterality: N/A;   COLONOSCOPY WITH PROPOFOL N/A 02/24/2022   Procedure: COLONOSCOPY WITH PROPOFOL;  Surgeon: Regis Bill, MD;  Location: [facility] ENDOSCOPY;  Service: Endoscopy;  Laterality: N/A;  DM   EXTRACORPOREAL SHOCK WAVE LITHOTRIPSY Left 05/05/2019   Procedure: EXTRACORPOREAL SHOCK WAVE LITHOTRIPSY (ESWL);  Surgeon: [name], MD;  Location: [facility] ORS;  Service: Urology;  Laterality: Left;   EXTRACORPOREAL SHOCK WAVE LITHOTRIPSY Right 01/10/2021   Procedure: EXTRACORPOREAL SHOCK WAVE LITHOTRIPSY (ESWL);  Surgeon: [name], MD;  Location: [facility] ORS;  Service: Urology;  Laterality: Right;   fish bone removal     from throat   TONSILLECTOMY     WRIST SURGERY Right    Tendonitis    Prior to Admission medications   Medication Sig Start Date End Date Taking? Authorizing Provider  dapagliflozin propanediol (FARXIGA) 10 MG TABS tablet TAKE ONE TABLET BY MOUTH EVERY MORNING 04/13/18  Yes [provider]  losartan (COZAAR) 50 MG tablet Take by mouth. 03/07/14  Yes [provider]  metFORMIN (GLUCOPHAGE-XR) 500 MG 24 hr tablet Take by mouth. Takes 4 tabs through the day. 02/17/18  Yes  [provider]  rosuvastatin (CRESTOR) 20 MG tablet Take by mouth. 02/10/18  Yes [provider]  Semaglutide, 1 MG/DOSE, (OZEMPIC, 1 MG/DOSE,) 4 MG/3ML SOPN  09/23/21  Yes [provider]  tamsulosin (FLOMAX) 0.4 MG CAPS capsule Take 1 capsule (0.4 mg total) by mouth daily. 12/27/21  Yes Stoioff, Verna Czech, MD  ASSURE COMFORT LANCETS 28G MISC 1 each by Other route 2 (two) times daily Use 1 each twice daily as directed. DX E 11.65 02/17/18   [provider]  glucose blood test strip Use 1 strip via meter twice daily as directed. Dx. E11.65 06/21/18   [provider]  OneTouch Delica Lancets 33G MISC USE TWICE DAILY 12/16/18   [provider]    Allergies as of 01/17/2022 - Review Complete 12/27/2021  Allergen Reaction Noted   Lisinopril Other (See Comments), Cough, and Nausea And Vomiting 09/04/2014    History reviewed. No pertinent family history.  Social History   Socioeconomic History   Marital status: Single    Spouse name: Not on file   Number of children: Not on file   Years of education: Not on file   Highest education level: Not on file  Occupational History   Not on file  Tobacco Use   Smoking status: Never   Smokeless tobacco: Never  Vaping Use   Vaping Use: Never used  Substance and Sexual Activity   Alcohol use: Yes    Comment: occasionally   Drug use:  No   Sexual activity: Not on file  Other Topics Concern   Not on file  Social History Narrative   Not on file   Social Determinants of Health   Financial Resource Strain: Not on file  Food Insecurity: Not on file  Transportation Needs: Not on file  Physical Activity: Not on file  Stress: Not on file  Social Connections: Not on file  Intimate Partner Violence: Not on file    Review of Systems: See HPI, otherwise negative ROS  Physical Exam: BP (!) 157/90   Pulse 78   Temp (!) 97.4 F (36.3 C) (Temporal)   Ht 5\' 6"  (1.676 m)   Wt 88.9 kg   SpO2 98%    BMI 31.64 kg/m  General:   Alert, cooperative in NAD Head:  Normocephalic and atraumatic. Respiratory:  Normal work of breathing. Cardiovascular:  RRR  Impression/Plan: Devon Valencia is here for cataract surgery.  Risks, benefits, limitations, and alternatives regarding cataract surgery have been reviewed with the patient.  Questions have been answered.  All parties agreeable.   Cleda Clarks, MD  03/10/2022, 9:14 AM

## 2022-03-10 NOTE — Op Note (Signed)
OPERATIVE NOTE  Devon Valencia [id number] 03/10/2022   PREOPERATIVE DIAGNOSIS:  Nuclear sclerotic cataract right eye.  H25.11   POSTOPERATIVE DIAGNOSIS:    Nuclear sclerotic cataract right eye.     PROCEDURE:  Phacoemusification with posterior chamber intraocular lens placement of the right eye   LENS:   Implant Name Type Inv. Item Serial No. Manufacturer Lot No. LRB No. Used Action  LENS IOL TECNIS EYHANCE 18.0 - [id number] Intraocular Lens LENS IOL TECNIS EYHANCE 18.0 [id number] [employer]  Right 1 Implanted       Procedure(s) with comments: CATARACT EXTRACTION PHACO AND INTRAOCULAR LENS PLACEMENT (IOC) RIGHT DIABETIC ITRAVITREAL KENALOG INJECTION 3.27 00:21.2 (Right) - Diabetic sleep apnea  DIB00 +18.0   ULTRASOUND TIME: 0 minutes 21 seconds.  CDE 3.27   SURGEON:  Benay Pillow, MD, MPH  ANESTHESIOLOGIST: Anesthesiologist: Dimas Millin, MD CRNA: Tobie Poet, CRNA   ANESTHESIA:  Topical with tetracaine drops augmented with 1% preservative-free intracameral lidocaine.  ESTIMATED BLOOD LOSS: less than 1 mL.   COMPLICATIONS:  None.   DESCRIPTION OF PROCEDURE:  The patient was identified in the holding room and transported to the operating room and placed in the supine position under the operating microscope.  The right eye was identified as the operative eye and it was prepped and draped in the usual sterile ophthalmic fashion.   A 1.0 millimeter clear-corneal paracentesis was made at the 10:30 position. 0.5 ml of preservative-free 1% lidocaine with epinephrine was injected into the anterior chamber.  The anterior chamber was filled with viscoelastic.  A 2.4 millimeter keratome was used to make a near-clear corneal incision at the 8:00 position.  A curvilinear capsulorrhexis was made with a cystotome and capsulorrhexis forceps.  Balanced salt solution was used to hydrodissect and hydrodelineate the nucleus.   Phacoemulsification was then used in stop and chop fashion to  remove the lens nucleus and epinucleus.  The remaining cortex was then removed using the irrigation and aspiration handpiece. Viscoelastic was then placed into the capsular bag to distend it for lens placement.  A lens was then injected into the capsular bag.  The remaining viscoelastic was aspirated.   Wounds were hydrated with balanced salt solution.  The anterior chamber was inflated to a physiologic pressure with balanced salt solution.   Intracameral vigamox 0.1 mL undiluted was injected into the eye and a drop placed onto the ocular surface.  No wound leaks were noted.  The patient was taken to the recovery room in stable condition without complications of anesthesia or surgery.    Overall, the surgery went very smoothly without difficulty but the patient did complain of discomfort.  Additional doses of MAC anesthesia were requested and provided.  Benay Pillow 03/10/2022, 9:49 AM

## 2022-03-10 NOTE — Anesthesia Postprocedure Evaluation (Signed)
Anesthesia Post Note  Patient: Devon Valencia  Procedure(s) Performed: CATARACT EXTRACTION PHACO AND INTRAOCULAR LENS PLACEMENT (IOC) RIGHT DIABETIC ITRAVITREAL KENALOG INJECTION 3.27 00:21.2 (Right: Eye)     Anesthesia Post Evaluation No notable events documented.  Domenic Moras

## 2022-03-10 NOTE — Anesthesia Postprocedure Evaluation (Signed)
Anesthesia Post Note  Patient: Devon Valencia  Procedure(s) Performed: CATARACT EXTRACTION PHACO AND INTRAOCULAR LENS PLACEMENT (IOC) RIGHT DIABETIC ITRAVITREAL KENALOG INJECTION 3.27 00:21.2 (Right: Eye)     Patient location during evaluation: PACU Anesthesia Type: MAC Level of consciousness: awake and alert Pain management: pain level controlled Vital Signs Assessment: post-procedure vital signs reviewed and stable Respiratory status: spontaneous breathing, nonlabored ventilation, respiratory function stable and patient connected to nasal cannula oxygen Cardiovascular status: stable and blood pressure returned to baseline Postop Assessment: no apparent nausea or vomiting Anesthetic complications: no   No notable events documented.  Dimas Millin

## 2022-03-10 NOTE — Anesthesia Preprocedure Evaluation (Signed)
Anesthesia Evaluation  Patient identified by MRN, date of birth, ID band Patient awake    Reviewed: Allergy & Precautions, NPO status , Patient's Chart, lab work & pertinent test results  History of Anesthesia Complications (+) DIFFICULT AIRWAY and history of anesthetic complications  Airway Mallampati: III  TM Distance: >3 FB Neck ROM: full    Dental  (+) Dental Advidsory Given, Teeth Intact   Pulmonary neg shortness of breath, sleep apnea , neg COPD,    Pulmonary exam normal        Cardiovascular hypertension, (-) angina(-) Past MI and (-) CABG negative cardio ROS Normal cardiovascular exam     Neuro/Psych negative neurological ROS  negative psych ROS   GI/Hepatic Neg liver ROS, GERD  ,  Endo/Other  negative endocrine ROSdiabetes  Renal/GU      Musculoskeletal   Abdominal   Peds  Hematology negative hematology ROS (+)   Anesthesia Other Findings Past Medical History: No date: Arthritis No date: Chronic kidney disease No date: Diabetes mellitus without complication ([place]) No date: Difficult intubation     Comment:  pt reports he was told 02/24/22 that he was difficult to               intubate. No date: GERD (gastroesophageal reflux disease) No date: Glaucoma No date: Hypercholesteremia No date: Hypertension No date: Nephrolithiasis No date: Sleep apnea     Comment:  CPAP  Past Surgical History: 12/24/2015: COLONOSCOPY WITH PROPOFOL; N/A     Comment:  Procedure: COLONOSCOPY WITH PROPOFOL;  Surgeon: Lollie Sails, MD;  Location: [facility] ENDOSCOPY;  Service:               Endoscopy;  Laterality: N/A; 02/24/2022: COLONOSCOPY WITH PROPOFOL; N/A     Comment:  Procedure: COLONOSCOPY WITH PROPOFOL;  Surgeon:               Lesly Rubenstein, MD;  Location: [facility] ENDOSCOPY;                Service: Endoscopy;  Laterality: N/A;  DM 05/05/2019: EXTRACORPOREAL SHOCK WAVE LITHOTRIPSY; Left     Comment:   Procedure: EXTRACORPOREAL SHOCK WAVE LITHOTRIPSY (ESWL);              Surgeon: Abbie Sons, MD;  Location: [facility] ORS;                Service: Urology;  Laterality: Left; 01/10/2021: EXTRACORPOREAL SHOCK WAVE LITHOTRIPSY; Right     Comment:  Procedure: EXTRACORPOREAL SHOCK WAVE LITHOTRIPSY (ESWL);              Surgeon: Abbie Sons, MD;  Location: [facility] ORS;                Service: Urology;  Laterality: Right; No date: fish bone removal     Comment:  from throat No date: TONSILLECTOMY No date: WRIST SURGERY; Right     Comment:  Tendonitis  BMI    Body Mass Index: 31.64 kg/m      Reproductive/Obstetrics negative OB ROS                             Anesthesia Physical Anesthesia Plan  ASA: 3  Anesthesia Plan: MAC   Post-op Pain Management:    Induction: Intravenous  PONV Risk Score and Plan:   Airway Management Planned: Natural Airway and Nasal Cannula  Additional Equipment:   Intra-op Plan:   Post-operative Plan:   Informed Consent: I have reviewed the patients History and Physical, chart, labs and discussed the procedure including the risks, benefits and alternatives for the proposed anesthesia with the patient or authorized representative who has indicated his/her understanding and acceptance.     Dental Advisory Given  Plan Discussed with: Anesthesiologist, CRNA and Surgeon  Anesthesia Plan Comments: (Patient consented for risks of anesthesia including but not limited to:  - adverse reactions to medications - risk of airway placement if required - damage to eyes, teeth, lips or other oral mucosa - nerve damage due to positioning  - sore throat or hoarseness - Damage to heart, brain, nerves, lungs, other parts of body or loss of life  Patient voiced understanding.)        Anesthesia Quick Evaluation

## 2022-03-10 NOTE — Transfer of Care (Signed)
Immediate Anesthesia Transfer of Care Note  Patient: Devon Valencia  Procedure(s) Performed: CATARACT EXTRACTION PHACO AND INTRAOCULAR LENS PLACEMENT (IOC) RIGHT DIABETIC ITRAVITREAL KENALOG INJECTION 3.27 00:21.2 (Right: Eye)  Patient Location: PACU  Anesthesia Type: MAC  Level of Consciousness: awake, alert  and patient cooperative  Airway and Oxygen Therapy: Patient Spontanous Breathing and Patient connected to supplemental oxygen  Post-op Assessment: Post-op Vital signs reviewed, Patient's Cardiovascular Status Stable, Respiratory Function Stable, Patent Airway and No signs of Nausea or vomiting  Post-op Vital Signs: Reviewed and stable  Complications: No notable events documented.

## 2022-03-11 ENCOUNTER — Encounter: Payer: Self-pay | Admitting: Ophthalmology

## 2022-03-11 ENCOUNTER — Other Ambulatory Visit: Payer: Self-pay

## 2022-03-20 NOTE — Discharge Instructions (Signed)

## 2022-03-24 ENCOUNTER — Ambulatory Visit: Payer: Medicare Other | Admitting: Anesthesiology

## 2022-03-24 ENCOUNTER — Encounter: Payer: Self-pay | Admitting: Ophthalmology

## 2022-03-24 ENCOUNTER — Encounter
Admission: RE | Disposition: A | Discharge status: Home / Self Care | Payer: Self-pay | Source: Ambulatory Visit | Attending: Ophthalmology

## 2022-03-24 ENCOUNTER — Ambulatory Visit
Admission: RE | Admit: 2022-03-24 | Discharge: 2022-03-24 | Disposition: A | Discharge status: Home / Self Care | Payer: Medicare Other | Source: Ambulatory Visit | Attending: Ophthalmology | Admitting: Ophthalmology

## 2022-03-24 DIAGNOSIS — I129 Hypertensive chronic kidney disease with stage 1 through stage 4 chronic kidney disease, or unspecified chronic kidney disease: Secondary | ICD-10-CM | POA: Diagnosis not present

## 2022-03-24 DIAGNOSIS — H2512 Age-related nuclear cataract, left eye: Secondary | ICD-10-CM | POA: Insufficient documentation

## 2022-03-24 DIAGNOSIS — E1122 Type 2 diabetes mellitus with diabetic chronic kidney disease: Secondary | ICD-10-CM | POA: Insufficient documentation

## 2022-03-24 DIAGNOSIS — E1136 Type 2 diabetes mellitus with diabetic cataract: Secondary | ICD-10-CM | POA: Insufficient documentation

## 2022-03-24 DIAGNOSIS — E1139 Type 2 diabetes mellitus with other diabetic ophthalmic complication: Secondary | ICD-10-CM | POA: Diagnosis not present

## 2022-03-24 DIAGNOSIS — H42 Glaucoma in diseases classified elsewhere: Secondary | ICD-10-CM | POA: Insufficient documentation

## 2022-03-24 DIAGNOSIS — N189 Chronic kidney disease, unspecified: Secondary | ICD-10-CM | POA: Insufficient documentation

## 2022-03-24 HISTORY — PX: CATARACT EXTRACTION W/PHACO: SHX586

## 2022-03-24 LAB — GLUCOSE, CAPILLARY
Glucose-Capillary: 137 mg/dL — ABNORMAL HIGH (ref 70–99)
Glucose-Capillary: 131 mg/dL — ABNORMAL HIGH (ref 70–99)

## 2022-03-24 SURGERY — PHACOEMULSIFICATION, CATARACT, WITH IOL INSERTION
Anesthesia: Monitor Anesthesia Care | Site: Eye | Laterality: Left

## 2022-03-24 MED ORDER — LIDOCAINE HCL (PF) 2 % IJ SOLN
INTRAOCULAR | Status: DC | PRN
  Administered 2022-03-24: 1 mL via INTRAOCULAR

## 2022-03-24 MED ORDER — TETRACAINE HCL 0.5 % OP SOLN
1.0000 [drp] | OPHTHALMIC | Status: DC | PRN
  Administered 2022-03-24 (×3): 1 [drp] via OPHTHALMIC

## 2022-03-24 MED ORDER — SIGHTPATH DOSE#1 BSS IO SOLN
INTRAOCULAR | Status: DC | PRN
  Administered 2022-03-24: 15 mL

## 2022-03-24 MED ORDER — FENTANYL CITRATE (PF) 100 MCG/2ML IJ SOLN
INTRAMUSCULAR | Status: DC | PRN
  Administered 2022-03-24 (×2): 50 ug via INTRAVENOUS

## 2022-03-24 MED ORDER — DEXMEDETOMIDINE (PRECEDEX) IN NS 20 MCG/5ML (4 MCG/ML) IV SYRINGE
PREFILLED_SYRINGE | INTRAVENOUS | Status: DC | PRN
  Administered 2022-03-24 (×2): 4 ug via INTRAVENOUS

## 2022-03-24 MED ORDER — LACTATED RINGERS IV SOLN: INTRAVENOUS | Status: DC

## 2022-03-24 MED ORDER — MOXIFLOXACIN HCL 0.5 % OP SOLN
OPHTHALMIC | Status: DC | PRN
  Administered 2022-03-24: 0.2 mL via OPHTHALMIC

## 2022-03-24 MED ORDER — SIGHTPATH DOSE#1 NA HYALUR & NA CHOND-NA HYALUR IO KIT
PACK | INTRAOCULAR | Status: DC | PRN
  Administered 2022-03-24: 1 via OPHTHALMIC

## 2022-03-24 MED ORDER — MIDAZOLAM HCL 2 MG/2ML IJ SOLN
INTRAMUSCULAR | Status: DC | PRN
  Administered 2022-03-24: 2 mg via INTRAVENOUS

## 2022-03-24 MED ORDER — ARMC OPHTHALMIC DILATING DROPS
1.0000 | OPHTHALMIC | Status: DC | PRN
  Administered 2022-03-24 (×3): 1 via OPHTHALMIC

## 2022-03-24 MED ORDER — SIGHTPATH DOSE#1 BSS IO SOLN
INTRAOCULAR | Status: DC | PRN
  Administered 2022-03-24: 87 mL via OPHTHALMIC

## 2022-03-24 SURGICAL SUPPLY — 14 items
CATARACT SUITE SIGHTPATH (MISCELLANEOUS) ×1 IMPLANT
DISSECTOR HYDRO NUCLEUS 50X22 (MISCELLANEOUS) ×1 IMPLANT
FEE CATARACT SUITE SIGHTPATH (MISCELLANEOUS) ×1 IMPLANT
GLOVE SURG GAMMEX PI TX LF 7.5 (GLOVE) ×1 IMPLANT
GLOVE SURG SYN 8.5  E (GLOVE) ×1
GLOVE SURG SYN 8.5 E (GLOVE) ×1 IMPLANT
GLOVE SURG SYN 8.5 PF PI (GLOVE) ×1 IMPLANT
LENS IOL TECNIS EYHANCE 17.0 (Intraocular Lens) IMPLANT
NDL FILTER BLUNT 18X1 1/2 (NEEDLE) ×1 IMPLANT
NEEDLE FILTER BLUNT 18X 1/2SAF (NEEDLE) ×1
NEEDLE FILTER BLUNT 18X1 1/2 (NEEDLE) ×1 IMPLANT
SYR 3ML LL SCALE MARK (SYRINGE) ×1 IMPLANT
SYR 5ML LL (SYRINGE) ×1 IMPLANT
WATER STERILE IRR 250ML POUR (IV SOLUTION) ×1 IMPLANT

## 2022-03-24 NOTE — Anesthesia Postprocedure Evaluation (Signed)
Anesthesia Post Note  Patient: Devon Valencia  Procedure(s) Performed: CATARACT EXTRACTION PHACO AND INTRAOCULAR LENS PLACEMENT (IOC) LEFT DIABETIC 4.10 00:28.5 (Left: Eye)     Patient location during evaluation: PACU Anesthesia Type: MAC Level of consciousness: awake and alert Pain management: pain level controlled Vital Signs Assessment: post-procedure vital signs reviewed and stable Respiratory status: spontaneous breathing, nonlabored ventilation, respiratory function stable and patient connected to nasal cannula oxygen Cardiovascular status: blood pressure returned to baseline and stable Postop Assessment: no apparent nausea or vomiting Anesthetic complications: no   No notable events documented.  [name]

## 2022-03-24 NOTE — H&P (Signed)
[name]   Primary Care Physician:  Lauro Regulus, MD Ophthalmologist: Dr. Willey Blade  Pre-Procedure History & Physical: HPI:  Devon Valencia is a 71 y.o. male here for cataract surgery.   Past Medical History:  Diagnosis Date   Arthritis    Chronic kidney disease    Diabetes mellitus without complication (HCC)    Difficult intubation    pt reports he was told 02/24/22 that he was difficult to intubate.   GERD (gastroesophageal reflux disease)    Glaucoma    Hypercholesteremia    Hypertension    Nephrolithiasis    Sleep apnea    CPAP    Past Surgical History:  Procedure Laterality Date   CATARACT EXTRACTION W/PHACO Right 03/10/2022   Procedure: CATARACT EXTRACTION PHACO AND INTRAOCULAR LENS PLACEMENT (IOC) RIGHT DIABETIC ITRAVITREAL KENALOG INJECTION 3.27 00:21.2;  Surgeon: Nevada Crane, MD;  Location: [facility];  Service: Ophthalmology;  Laterality: Right;  Diabetic sleep apnea   COLONOSCOPY WITH PROPOFOL N/A 12/24/2015   Procedure: COLONOSCOPY WITH PROPOFOL;  Surgeon: Christena Deem, MD;  Location: [facility] ENDOSCOPY;  Service: Endoscopy;  Laterality: N/A;   COLONOSCOPY WITH PROPOFOL N/A 02/24/2022   Procedure: COLONOSCOPY WITH PROPOFOL;  Surgeon: Regis Bill, MD;  Location: [facility] ENDOSCOPY;  Service: Endoscopy;  Laterality: N/A;  DM   EXTRACORPOREAL SHOCK WAVE LITHOTRIPSY Left 05/05/2019   Procedure: EXTRACORPOREAL SHOCK WAVE LITHOTRIPSY (ESWL);  Surgeon: [name], MD;  Location: [facility] ORS;  Service: Urology;  Laterality: Left;   EXTRACORPOREAL SHOCK WAVE LITHOTRIPSY Right 01/10/2021   Procedure: EXTRACORPOREAL SHOCK WAVE LITHOTRIPSY (ESWL);  Surgeon: [name], MD;  Location: [facility] ORS;  Service: Urology;  Laterality: Right;   fish bone removal     from throat   TONSILLECTOMY     WRIST SURGERY Right    Tendonitis    Prior to Admission medications   Medication Sig Start Date End Date Taking? Authorizing Provider   metFORMIN (GLUCOPHAGE-XR) 500 MG 24 hr tablet Take by mouth. Takes 4 tabs through the day. 02/17/18  Yes [provider]  rosuvastatin (CRESTOR) 20 MG tablet Take by mouth. 02/10/18  Yes [provider]  Semaglutide, 1 MG/DOSE, (OZEMPIC, 1 MG/DOSE,) 4 MG/3ML SOPN  09/23/21  Yes [provider]  tamsulosin (FLOMAX) 0.4 MG CAPS capsule Take 1 capsule (0.4 mg total) by mouth daily. 12/27/21  Yes Stoioff, Verna Czech, MD  ASSURE COMFORT LANCETS 28G MISC 1 each by Other route 2 (two) times daily Use 1 each twice daily as directed. DX E 11.65 02/17/18   [provider]  dapagliflozin propanediol (FARXIGA) 10 MG TABS tablet TAKE ONE TABLET BY MOUTH EVERY MORNING 04/13/18   [provider]  glucose blood test strip Use 1 strip via meter twice daily as directed. Dx. E11.65 06/21/18   [provider]  losartan (COZAAR) 50 MG tablet Take by mouth. 03/07/14   [provider]  OneTouch Delica Lancets 33G MISC USE TWICE DAILY 12/16/18   [provider]    Allergies as of 01/17/2022 - Review Complete 12/27/2021  Allergen Reaction Noted   Lisinopril Other (See Comments), Cough, and Nausea And Vomiting 09/04/2014    History reviewed. No pertinent family history.  Social History   Socioeconomic History   Marital status: Single    Spouse name: Not on file   Number of children: Not on file   Years of education: Not on file   Highest education level: Not on file  Occupational History  Not on file  Tobacco Use   Smoking status: Never   Smokeless tobacco: Never  Vaping Use   Vaping Use: Never used  Substance and Sexual Activity   Alcohol use: Yes    Comment: occasionally   Drug use: No   Sexual activity: Not on file  Other Topics Concern   Not on file  Social History Narrative   Not on file   Social Determinants of Health   Financial Resource Strain: Not on file  Food Insecurity: Not on file  Transportation Needs: Not on file   Physical Activity: Not on file  Stress: Not on file  Social Connections: Not on file  Intimate Partner Violence: Not on file    Review of Systems: See HPI, otherwise negative ROS  Physical Exam: BP (!) 158/84   Pulse (!) 58   Temp (!) 97.2 F (36.2 C)   Wt 88.8 kg   SpO2 98%   BMI 31.60 kg/m  General:   Alert, cooperative in NAD Head:  Normocephalic and atraumatic. Respiratory:  Normal work of breathing. Cardiovascular:  RRR  Impression/Plan: Devon Valencia is here for cataract surgery.  Risks, benefits, limitations, and alternatives regarding cataract surgery have been reviewed with the patient.  Questions have been answered.  All parties agreeable.   Willey Blade, MD  03/24/2022, 10:35 AM

## 2022-03-24 NOTE — Anesthesia Preprocedure Evaluation (Signed)
Anesthesia Evaluation  Patient identified by MRN, date of birth, ID band Patient awake    Reviewed: Allergy & Precautions, H&P , NPO status , Patient's Chart, lab work & pertinent test results, reviewed documented beta blocker date and time   Airway Mallampati: II  TM Distance: >3 FB Neck ROM: full    Dental no notable dental hx. (+) Teeth Intact   Pulmonary sleep apnea ,    Pulmonary exam normal breath sounds clear to auscultation       Cardiovascular Exercise Tolerance: Good hypertension, On Medications negative cardio ROS   Rhythm:regular Rate:Normal     Neuro/Psych negative neurological ROS  negative psych ROS   GI/Hepatic Neg liver ROS, GERD  Medicated,  Endo/Other  negative endocrine ROSdiabetes, Well Controlled  Renal/GU Renal disease     Musculoskeletal   Abdominal   Peds  Hematology negative hematology ROS (+)   Anesthesia Other Findings   Reproductive/Obstetrics negative OB ROS                             Anesthesia Physical Anesthesia Plan  ASA: 3  Anesthesia Plan: MAC   Post-op Pain Management:    Induction:   PONV Risk Score and Plan:   Airway Management Planned:   Additional Equipment:   Intra-op Plan:   Post-operative Plan:   Informed Consent: I have reviewed the patients History and Physical, chart, labs and discussed the procedure including the risks, benefits and alternatives for the proposed anesthesia with the patient or authorized representative who has indicated his/her understanding and acceptance.       Plan Discussed with: CRNA  Anesthesia Plan Comments:         Anesthesia Quick Evaluation

## 2022-03-24 NOTE — Op Note (Signed)
OPERATIVE NOTE  Devon Valencia [id number] 03/24/2022   PREOPERATIVE DIAGNOSIS:  Nuclear sclerotic cataract left eye.  H25.12   POSTOPERATIVE DIAGNOSIS:    Nuclear sclerotic cataract left eye.     PROCEDURE:  Phacoemusification with posterior chamber intraocular lens placement of the left eye   LENS:   Implant Name Type Inv. Item Serial No. Manufacturer Lot No. LRB No. Used Action  LENS IOL TECNIS EYHANCE 17.0 - [id number] Intraocular Lens LENS IOL TECNIS EYHANCE 17.0 [id number] [employer]  Left 1 Implanted      Procedure(s) with comments: CATARACT EXTRACTION PHACO AND INTRAOCULAR LENS PLACEMENT (IOC) LEFT DIABETIC 4.10 00:28.5 (Left) - Diabetic slepp apnea  DIB00 +17.0   ULTRASOUND TIME: 0 minutes 28 seconds.  CDE 4.10   SURGEON:  Willey Blade, MD, MPH   ANESTHESIA:  Topical with tetracaine drops augmented with 1% preservative-free intracameral lidocaine.  ESTIMATED BLOOD LOSS: <1 mL   COMPLICATIONS:  None.   DESCRIPTION OF PROCEDURE:  The patient was identified in the holding room and transported to the operating room and placed in the supine position under the operating microscope.  The left eye was identified as the operative eye and it was prepped and draped in the usual sterile ophthalmic fashion.   A 1.0 millimeter clear-corneal paracentesis was made at the 5:00 position. 0.5 ml of preservative-free 1% lidocaine with epinephrine was injected into the anterior chamber.  The anterior chamber was filled with viscoelastic.  A 2.4 millimeter keratome was used to make a near-clear corneal incision at the 2:00 position.  A curvilinear capsulorrhexis was made with a cystotome and capsulorrhexis forceps.  Balanced salt solution was used to hydrodissect and hydrodelineate the nucleus.   Phacoemulsification was then used in stop and chop fashion to remove the lens nucleus and epinucleus.  The remaining cortex was then removed using the irrigation and aspiration handpiece. Viscoelastic  was then placed into the capsular bag to distend it for lens placement.  A lens was then injected into the capsular bag.  The remaining viscoelastic was aspirated.   Wounds were hydrated with balanced salt solution.  The anterior chamber was inflated to a physiologic pressure with balanced salt solution.  The patient was much more comfortable during the surgery compared to the first cataract surgery.  Intracameral vigamox 0.1 mL undiltued was injected into the eye and a drop placed onto the ocular surface.  No wound leaks were noted.  The patient was taken to the recovery room in stable condition without complications of anesthesia or surgery  Willey Blade 03/24/2022, 11:09 AM

## 2022-03-24 NOTE — Transfer of Care (Signed)
Immediate Anesthesia Transfer of Care Note  Patient: Devon Valencia  Procedure(s) Performed: CATARACT EXTRACTION PHACO AND INTRAOCULAR LENS PLACEMENT (IOC) LEFT DIABETIC 4.10 00:28.5 (Left: Eye)  Patient Location: PACU  Anesthesia Type: MAC  Level of Consciousness: awake, alert  and patient cooperative  Airway and Oxygen Therapy: Patient Spontanous Breathing and Patient connected to supplemental oxygen  Post-op Assessment: Post-op Vital signs reviewed, Patient's Cardiovascular Status Stable, Respiratory Function Stable, Patent Airway and No signs of Nausea or vomiting  Post-op Vital Signs: Reviewed and stable  Complications: No notable events documented.

## 2022-03-25 ENCOUNTER — Encounter: Payer: Self-pay | Admitting: Ophthalmology

## 2022-06-04 NOTE — Addendum Note (Signed)
Addendum  created 06/04/22 1000 by Andee Poles, CRNA   Intraprocedure Staff edited

## 2022-07-01 ENCOUNTER — Ambulatory Visit: Payer: Medicare Other | Admitting: Podiatry

## 2022-07-22 ENCOUNTER — Ambulatory Visit (INDEPENDENT_AMBULATORY_CARE_PROVIDER_SITE_OTHER): Payer: Medicare Other | Admitting: Podiatry

## 2022-07-22 DIAGNOSIS — B351 Tinea unguium: Secondary | ICD-10-CM

## 2022-07-22 DIAGNOSIS — M79674 Pain in right toe(s): Secondary | ICD-10-CM

## 2022-07-22 DIAGNOSIS — M79675 Pain in left toe(s): Secondary | ICD-10-CM | POA: Diagnosis not present

## 2022-07-22 NOTE — Progress Notes (Signed)
   Chief Complaint  Patient presents with   Diabetes    Diabetic foot care, A1c-7.1 BG- not taking, nail trim     SUBJECTIVE Patient with a history of diabetes mellitus presents to office today complaining of elongated, thickened nails that cause pain while ambulating in shoes.  Patient is unable to trim their own nails. Patient is here for further evaluation and treatment.  Past Medical History:  Diagnosis Date   Arthritis    Chronic kidney disease    Diabetes mellitus without complication ([place])    Difficult intubation    pt reports he was told 02/24/22 that he was difficult to intubate.   GERD (gastroesophageal reflux disease)    Glaucoma    Hypercholesteremia    Hypertension    Nephrolithiasis    Sleep apnea    CPAP    Allergies  Allergen Reactions   Lisinopril Nausea And Vomiting and Cough     OBJECTIVE General Patient is awake, alert, and oriented x 3 and in no acute distress. Derm Skin is dry and supple bilateral. Negative open lesions or macerations. Remaining integument unremarkable. Nails are tender, long, thickened and dystrophic with subungual debris, consistent with onychomycosis, 1-5 bilateral. No signs of infection noted. Vasc  DP and PT pedal pulses palpable bilaterally. Temperature gradient within normal limits.  Neuro Epicritic and protective threshold sensation diminished bilaterally.  Musculoskeletal Exam No symptomatic pedal deformities noted bilateral. Muscular strength within normal limits.  ASSESSMENT 1. Diabetes Mellitus w/ peripheral neuropathy 2.  Pain due to onychomycosis of toenails bilateral  PLAN OF CARE 1. Patient evaluated today. 2. Instructed to maintain good pedal hygiene and foot care. Stressed importance of controlling blood sugar.  3. Mechanical debridement of nails 1-5 bilaterally performed using a nail nipper. Filed with dremel without incident.  4. Return to clinic in 3 mos.     Edrick Kins, DPM [facility]  Dr. Edrick Kins, DPM    [address], [place] [address]                Office [phone]  Fax [phone]

## 2023-01-01 ENCOUNTER — Ambulatory Visit
Admission: RE | Admit: 2023-01-01 | Discharge: 2023-01-01 | Disposition: A | Discharge status: Home / Self Care | Payer: Medicare Other | Source: Ambulatory Visit | Attending: Urology | Admitting: Urology

## 2023-01-01 ENCOUNTER — Encounter: Payer: Self-pay | Admitting: Urology

## 2023-01-01 ENCOUNTER — Ambulatory Visit: Payer: Medicare Other | Admitting: Urology

## 2023-01-01 ENCOUNTER — Ambulatory Visit (INDEPENDENT_AMBULATORY_CARE_PROVIDER_SITE_OTHER): Payer: Medicare Other | Admitting: Urology

## 2023-01-01 VITALS — BP 121/77 | HR 76 | Ht 66.0 in | Wt 198.0 lb

## 2023-01-01 DIAGNOSIS — N138 Other obstructive and reflux uropathy: Secondary | ICD-10-CM

## 2023-01-01 DIAGNOSIS — R972 Elevated prostate specific antigen [PSA]: Secondary | ICD-10-CM

## 2023-01-01 DIAGNOSIS — N200 Calculus of kidney: Secondary | ICD-10-CM

## 2023-01-01 DIAGNOSIS — N401 Enlarged prostate with lower urinary tract symptoms: Secondary | ICD-10-CM

## 2023-01-01 MED ORDER — TAMSULOSIN HCL 0.4 MG PO CAPS
0.4000 mg | ORAL_CAPSULE | Freq: Every day | ORAL | 11 refills | Status: DC
Start: 2023-01-01 — End: 2024-01-01

## 2023-01-01 NOTE — Progress Notes (Signed)
Devon Valencia,acting as a scribe for [name], MD.,have documented all relevant documentation on the behalf of [name], MD,as directed by  [name], MD while in the presence of [name], MD.  12/31/2021 1:33 PM   Devon Valencia 26-Jul-1950 [id number]  Referring provider: Lauro Regulus, MD [address] [facility] [address] [place],  [place] [address]  Chief Complaint  Patient presents with   Nephrolithiasis    Urologic history:    1. Bilateral nephrolithiasis  -ESWL 04/2019 5 mm left proximal ureteral calculus -Last CT 05/2019 bilateral renal calculi; largest 11 mm left kidney - SWL 7 mm right proximal ureteral calculus 01/10/2021   2.  History elevated PSA -Previous biopsy around 2010 which was benign.  PSA at time of biopsy unknown; subsequent PSAs normal and stable   3.  BPH with mild lower urinary tract symptoms -Tamsulosin  HPI: 72 y.o. male presents for annual follow-up.  No problems since visit Stable LUTS on tamsulosin Doing well since last visit Denies dysuria, gross hematuria Denies flank, abdominal or pelvic pain Has taken Litholyte intermittently, but is trying to do better. He has also increased his intake of lemonade.  PMH: Past Medical History:  Diagnosis Date   Arthritis    Chronic kidney disease    Diabetes mellitus without complication (HCC)    Difficult intubation    pt reports he was told 02/24/22 that he was difficult to intubate.   GERD (gastroesophageal reflux disease)    Glaucoma    Hypercholesteremia    Hypertension    Nephrolithiasis    Sleep apnea    CPAP    Surgical History: Past Surgical History:  Procedure Laterality Date   CATARACT EXTRACTION W/PHACO Right 03/10/2022   Procedure: CATARACT EXTRACTION PHACO AND INTRAOCULAR LENS PLACEMENT (IOC) RIGHT DIABETIC ITRAVITREAL KENALOG INJECTION 3.27 00:21.2;  Surgeon: Nevada Crane, MD;  Location: [facility]ERY [facility];  Service:  Ophthalmology;  Laterality: Right;  Diabetic sleep apnea   CATARACT EXTRACTION W/PHACO Left 03/24/2022   Procedure: CATARACT EXTRACTION PHACO AND INTRAOCULAR LENS PLACEMENT (IOC) LEFT DIABETIC 4.10 00:28.5;  Surgeon: Nevada Crane, MD;  Location: [facility];  Service: Ophthalmology;  Laterality: Left;  Diabetic slepp apnea   COLONOSCOPY WITH PROPOFOL N/A 12/24/2015   Procedure: COLONOSCOPY WITH PROPOFOL;  Surgeon: Christena Deem, MD;  Location: [facility] ENDOSCOPY;  Service: Endoscopy;  Laterality: N/A;   COLONOSCOPY WITH PROPOFOL N/A 02/24/2022   Procedure: COLONOSCOPY WITH PROPOFOL;  Surgeon: Regis Bill, MD;  Location: [facility] ENDOSCOPY;  Service: Endoscopy;  Laterality: N/A;  DM   EXTRACORPOREAL SHOCK WAVE LITHOTRIPSY Left 05/05/2019   Procedure: EXTRACORPOREAL SHOCK WAVE LITHOTRIPSY (ESWL);  Surgeon: [name], MD;  Location: [facility] ORS;  Service: Urology;  Laterality: Left;   EXTRACORPOREAL SHOCK WAVE LITHOTRIPSY Right 01/10/2021   Procedure: EXTRACORPOREAL SHOCK WAVE LITHOTRIPSY (ESWL);  Surgeon: [name], MD;  Location: [facility] ORS;  Service: Urology;  Laterality: Right;   fish bone removal     from throat   TONSILLECTOMY     WRIST SURGERY Right    Tendonitis    Home Medications:  Allergies as of 01/01/2023       Reactions   Lisinopril Nausea And Vomiting, Cough        Medication List        Accurate as of January 01, 2023  1:33 PM. If you have any questions, ask your nurse or doctor.  Assure Comfort Lancets 28G Misc 1 each by Other route 2 (two) times daily Use 1 each twice daily as directed. DX E 11.65   OneTouch Delica Lancets 33G Misc USE TWICE DAILY   dapagliflozin propanediol 10 MG Tabs tablet Commonly known as: FARXIGA TAKE ONE TABLET BY MOUTH EVERY MORNING   glucose blood test strip Use 1 strip via meter twice daily as directed. Dx. E11.65   losartan 50 MG tablet Commonly known as: COZAAR Take by mouth.   metFORMIN 500  MG 24 hr tablet Commonly known as: GLUCOPHAGE-XR Take by mouth. Takes 4 tabs through the day.   Ozempic (1 MG/DOSE) 4 MG/3ML Sopn Generic drug: Semaglutide (1 MG/DOSE)   rosuvastatin 20 MG tablet Commonly known as: CRESTOR Take by mouth.   tamsulosin 0.4 MG Caps capsule Commonly known as: FLOMAX Take 1 capsule (0.4 mg total) by mouth daily.        Allergies:  Allergies  Allergen Reactions   Lisinopril Nausea And Vomiting and Cough   Social History:  reports that he has never smoked. He has never used smokeless tobacco. He reports current alcohol use. He reports that he does not use drugs.   Physical Exam: BP 121/77   Pulse 76   Ht 5\' 6"  (1.676 m)   Wt 198 lb (89.8 kg)   BMI 31.96 kg/m   Constitutional:  Alert and oriented, No acute distress. HEENT: Cataract AT, moist mucus membranes.  Trachea midline, no masses. Cardiovascular: No clubbing, cyanosis, or edema. Respiratory: Normal respiratory effort, no increased work of breathing. Skin: No rashes, bruises or suspicious lesions. Neurologic: Grossly intact, no focal deficits, moving all 4 extremities. Psychiatric: Normal mood and affect.  Assessment & Plan:    1.  BPH with LUTS Stable on tamsulosin; refill sent to pharmacy  2. History of elevated PSA PSA drawn today  3. Nephrolithiasis KUB ordered today Continue Litholyte Continue annual follow up  I have reviewed the above documentation for accuracy and completeness, and I agree with the above.   [name], MD  [facility] [address], [address] [place], [place] [address] [phone]

## 2023-01-03 LAB — PSA: Prostate Specific Ag, Serum: 2.2 ng/mL (ref 0.0–4.0)

## 2023-12-24 ENCOUNTER — Other Ambulatory Visit: Payer: Self-pay | Admitting: *Deleted

## 2023-12-24 DIAGNOSIS — R972 Elevated prostate specific antigen [PSA]: Secondary | ICD-10-CM

## 2023-12-30 ENCOUNTER — Other Ambulatory Visit: Payer: Self-pay

## 2023-12-30 DIAGNOSIS — R972 Elevated prostate specific antigen [PSA]: Secondary | ICD-10-CM

## 2023-12-31 LAB — PSA: Prostate Specific Ag, Serum: 2.5 ng/mL (ref 0.0–4.0)

## 2024-01-01 ENCOUNTER — Ambulatory Visit
Admission: RE | Admit: 2024-01-01 | Discharge: 2024-01-01 | Disposition: A | Discharge status: Home / Self Care | Source: Ambulatory Visit | Attending: Urology | Admitting: Urology

## 2024-01-01 ENCOUNTER — Encounter: Payer: Self-pay | Admitting: Urology

## 2024-01-01 ENCOUNTER — Ambulatory Visit (INDEPENDENT_AMBULATORY_CARE_PROVIDER_SITE_OTHER): Payer: Self-pay | Admitting: Urology

## 2024-01-01 VITALS — BP 141/86 | HR 67 | Ht 66.0 in | Wt 187.3 lb

## 2024-01-01 DIAGNOSIS — N200 Calculus of kidney: Secondary | ICD-10-CM | POA: Insufficient documentation

## 2024-01-01 DIAGNOSIS — N401 Enlarged prostate with lower urinary tract symptoms: Secondary | ICD-10-CM | POA: Diagnosis not present

## 2024-01-01 DIAGNOSIS — Z87898 Personal history of other specified conditions: Secondary | ICD-10-CM | POA: Diagnosis not present

## 2024-01-01 DIAGNOSIS — N138 Other obstructive and reflux uropathy: Secondary | ICD-10-CM

## 2024-01-01 MED ORDER — TAMSULOSIN HCL 0.4 MG PO CAPS: 0.4000 mg | ORAL_CAPSULE | Freq: Every day | ORAL | 3 refills | Status: AC

## 2024-01-01 NOTE — Progress Notes (Signed)
 01/01/2024 10:48 AM   Devon Valencia Jun 30, 1951 [id number]  Referring provider: Jimmy Moulding, MD [address] [facility] [place],  [place] [address]  Chief Complaint  Patient presents with   Benign Prostatic Hypertrophy    Urologic history:    1. Bilateral nephrolithiasis ESWL 04/2019 5 mm left proximal ureteral calculus Last CT 05/2019 bilateral renal calculi; largest 11 mm left kidney SWL 7 mm right proximal ureteral calculus 01/10/2021   2.  History elevated PSA Previous biopsy around 2010 which was benign. PSA at time of biopsy unknown; subsequent PSAs normal and stable   3.  BPH with mild lower urinary tract symptoms Tamsulosin   HPI: 73 y.o. male presents for annual follow-up.  No problems since visit Stable LUTS on tamsulosin , occasionally notes small amounts of urine leaking after he finishes voiding.  Not associated with urgency Denies dysuria, gross hematuria Denies flank, abdominal or pelvic pain PSA 12/30/2023 stable 2.5  PMH: Past Medical History:  Diagnosis Date   Arthritis    Chronic kidney disease    Diabetes mellitus without complication (HCC)    Difficult intubation    pt reports he was told 02/24/22 that he was difficult to intubate.   GERD (gastroesophageal reflux disease)    Glaucoma    Hypercholesteremia    Hypertension    Nephrolithiasis    Sleep apnea    CPAP    Surgical History: Past Surgical History:  Procedure Laterality Date   CATARACT EXTRACTION W/PHACO Right 03/10/2022   Procedure: CATARACT EXTRACTION PHACO AND INTRAOCULAR LENS PLACEMENT (IOC) RIGHT DIABETIC ITRAVITREAL KENALOG  INJECTION 3.27 00:21.2;  Surgeon: Rosa College, MD;  Location: [facility];  Service: Ophthalmology;  Laterality: Right;  Diabetic sleep apnea   CATARACT EXTRACTION W/PHACO Left 03/24/2022   Procedure: CATARACT EXTRACTION PHACO AND INTRAOCULAR LENS PLACEMENT (IOC) LEFT DIABETIC 4.10 00:28.5;  Surgeon: Rosa College, MD;  Location: [facility];  Service: Ophthalmology;  Laterality: Left;  Diabetic slepp apnea   COLONOSCOPY WITH PROPOFOL  N/A 12/24/2015   Procedure: COLONOSCOPY WITH PROPOFOL ;  Surgeon: Deveron Fly, MD;  Location: [facility];  Service: Endoscopy;  Laterality: N/A;   COLONOSCOPY WITH PROPOFOL  N/A 02/24/2022   Procedure: COLONOSCOPY WITH PROPOFOL ;  Surgeon: Shane Darling, MD;  Location: [facility] ENDOSCOPY;  Service: Endoscopy;  Laterality: N/A;  DM   EXTRACORPOREAL SHOCK WAVE LITHOTRIPSY Left 05/05/2019   Procedure: EXTRACORPOREAL SHOCK WAVE LITHOTRIPSY (ESWL);  Surgeon: Geraline Knapp, MD;  Location: [facility];  Service: Urology;  Laterality: Left;   EXTRACORPOREAL SHOCK WAVE LITHOTRIPSY Right 01/10/2021   Procedure: EXTRACORPOREAL SHOCK WAVE LITHOTRIPSY (ESWL);  Surgeon: Geraline Knapp, MD;  Location: [facility];  Service: Urology;  Laterality: Right;   fish bone removal     from throat   TONSILLECTOMY     WRIST SURGERY Right    Tendonitis    Home Medications:  Allergies as of 01/01/2024       Reactions   Lisinopril Nausea And Vomiting, Cough        Medication List        Accurate as of January 01, 2024 10:48 AM. If you have any questions, ask your nurse or doctor.          Assure Comfort Lancets 28G Misc 1 each by Other route 2 (two) times daily Use 1 each twice daily as directed. DX E 11.65   OneTouch Delica Lancets 33G Misc USE TWICE DAILY   dapagliflozin propanediol 10 MG  Tabs tablet Commonly known as: FARXIGA TAKE ONE TABLET BY MOUTH EVERY MORNING   glucose blood test strip Use 1 strip via meter twice daily as directed. Dx. E11.65   losartan 50 MG tablet Commonly known as: COZAAR Take by mouth.   metFORMIN 500 MG 24 hr tablet Commonly known as: GLUCOPHAGE-XR Take by mouth. Takes 4 tabs through the day.   Ozempic (1 MG/DOSE) 4 MG/3ML Sopn Generic drug: Semaglutide (1 MG/DOSE)   rosuvastatin 20 MG tablet Commonly known as:  CRESTOR Take by mouth.   tamsulosin  0.4 MG Caps capsule Commonly known as: FLOMAX  Take 1 capsule (0.4 mg total) by mouth daily.        Allergies:  Allergies  Allergen Reactions   Lisinopril Nausea And Vomiting and Cough   Social History:  reports that he has never smoked. He has never used smokeless tobacco. He reports current alcohol use. He reports that he does not use drugs.   Physical Exam: BP (!) 141/86   Pulse 67   Ht 5' 6 (1.676 m)   Wt 187 lb 4.8 oz (85 kg)   BMI 30.23 kg/m   Constitutional:  Alert and oriented, No acute distress. HEENT: [place] AT Respiratory: Normal respiratory effort, no increased work of breathing. Psychiatric: Normal mood and affect.  Assessment & Plan:    1.  BPH with LUTS Stable on tamsulosin ; refill sent to pharmacy Postvoid leakage without urge.  We discussed this is most commonly related to pooling of urine in the bulbar urethra  2. History of elevated PSA Stable PSA  3. Nephrolithiasis KUB ordered today If KUB stable will go to every other year imaging   Geraline Knapp, MD  [facility] [address] [place], [place] [address] [phone]

## 2024-01-09 ENCOUNTER — Ambulatory Visit: Payer: Self-pay | Admitting: Urology

## 2024-06-21 ENCOUNTER — Ambulatory Visit: Admitting: Podiatry

## 2024-07-01 ENCOUNTER — Ambulatory Visit

## 2024-07-01 ENCOUNTER — Encounter: Payer: Self-pay | Admitting: Podiatry

## 2024-07-01 ENCOUNTER — Ambulatory Visit: Admitting: Podiatry

## 2024-07-01 VITALS — Ht 66.0 in | Wt 187.3 lb

## 2024-07-01 DIAGNOSIS — M79675 Pain in left toe(s): Secondary | ICD-10-CM | POA: Diagnosis not present

## 2024-07-01 DIAGNOSIS — M79674 Pain in right toe(s): Secondary | ICD-10-CM

## 2024-07-01 DIAGNOSIS — M71379 Other bursal cyst, unspecified ankle and foot: Secondary | ICD-10-CM

## 2024-07-01 DIAGNOSIS — B351 Tinea unguium: Secondary | ICD-10-CM

## 2024-07-01 NOTE — Progress Notes (Signed)
" ° °  Chief Complaint  Patient presents with   Nail Problem    Pt is here for [facility] and yearly foot exam, states to have a cyst on the back of the left ankle that he is concern about, states it does not hurt.    SUBJECTIVE Patient with a history of diabetes mellitus presents to office today for routine annual diabetic foot exam.  He has also noticed a small lesion to the posterior aspect of the right leg along the Achilles tendon that he would like to have evaluated.  He noticed it about 1 month ago.  Nontender.  No history of injury  Past Medical History:  Diagnosis Date   Arthritis    Chronic kidney disease    Diabetes mellitus without complication (HCC)    Difficult intubation    pt reports he was told 02/24/22 that he was difficult to intubate.   GERD (gastroesophageal reflux disease)    Glaucoma    Hypercholesteremia    Hypertension    Nephrolithiasis    Sleep apnea    CPAP    Allergies[1]   OBJECTIVE General Patient is awake, alert, and oriented x 3 and in no acute distress. Derm Skin is dry and supple bilateral. Negative open lesions or macerations. Remaining integument unremarkable. Nails are tender, long, thickened and dystrophic with subungual debris, consistent with onychomycosis, 1-5 bilateral. No signs of infection noted. Vasc  DP and PT pedal pulses palpable bilaterally. Temperature gradient within normal limits.  Neuro light touch and protective threshold intact Musculoskeletal Exam No symptomatic pedal deformities noted bilateral. Muscular strength within normal limits.  There is a small nonadhered lesion just deep to the dermal tissue along the midportion of the Achilles tendon.  There is no tenderness with palpation.  Consistent with either a dermatofibroma VS. very small ganglion cyst.  Either way it appears very stable and not concerning  ASSESSMENT 1. Diabetes Mellitus; uncomplicated 2.  Pain due to onychomycosis of toenails bilateral 3.  Superficial cyst vs.  dermatofibroma right Achilles  PLAN OF CARE -Patient evaluated today.  Comprehensive diabetic foot exam performed today -Continue to maintain good pedal hygiene and foot care. Stressed importance of controlling blood sugar.  -Mechanical debridement of nails 1-5 bilaterally performed using a nail nipper. Filed with dremel without incident.  -Return to clinic annually    Thresa EMERSON Sar, DPM [facility]  Dr. Thresa EMERSON Sar, DPM    [address] [place], [place] [address]                Office [phone]  Fax [phone]         [1]  Allergies Allergen Reactions   Lisinopril Nausea And Vomiting and Cough   "

## 2024-12-28 ENCOUNTER — Other Ambulatory Visit

## 2024-12-30 ENCOUNTER — Ambulatory Visit: Admitting: Urology

## 2025-06-13 ENCOUNTER — Ambulatory Visit: Admitting: Podiatry
